# Patient Record
Sex: Female | Born: 1980 | Race: White | Hispanic: No | Smoking: Light tobacco smoker
Health system: Southern US, Community
[De-identification: ages and names within clinical notes are randomized; demographics above are authoritative.]

## PROBLEM LIST (undated history)

## (undated) DIAGNOSIS — R87629 Unspecified abnormal cytological findings in specimens from vagina: Secondary | ICD-10-CM

## (undated) DIAGNOSIS — M419 Scoliosis, unspecified: Secondary | ICD-10-CM

## (undated) DIAGNOSIS — R519 Headache, unspecified: Secondary | ICD-10-CM

## (undated) DIAGNOSIS — Z8619 Personal history of other infectious and parasitic diseases: Secondary | ICD-10-CM

## (undated) DIAGNOSIS — R87619 Unspecified abnormal cytological findings in specimens from cervix uteri: Secondary | ICD-10-CM

## (undated) DIAGNOSIS — R51 Headache: Secondary | ICD-10-CM

## (undated) DIAGNOSIS — IMO0002 Reserved for concepts with insufficient information to code with codable children: Secondary | ICD-10-CM

## (undated) HISTORY — DX: Reserved for concepts with insufficient information to code with codable children: IMO0002

## (undated) HISTORY — DX: Unspecified abnormal cytological findings in specimens from cervix uteri: R87.619

## (undated) HISTORY — PX: COLPOSCOPY: SHX161

## (undated) SURGERY — Surgical Case
Anesthesia: *Unknown

---

## 2001-05-06 ENCOUNTER — Emergency Department (HOSPITAL_COMMUNITY): Admission: EM | Admit: 2001-05-06 | Discharge: 2001-05-06 | Payer: Self-pay

## 2004-12-25 ENCOUNTER — Other Ambulatory Visit: Admission: RE | Admit: 2004-12-25 | Discharge: 2004-12-25 | Payer: Self-pay | Admitting: Family Medicine

## 2007-08-23 ENCOUNTER — Other Ambulatory Visit: Admission: RE | Admit: 2007-08-23 | Discharge: 2007-08-23 | Payer: Self-pay | Admitting: Family Medicine

## 2008-10-18 ENCOUNTER — Other Ambulatory Visit: Admission: RE | Admit: 2008-10-18 | Discharge: 2008-10-18 | Payer: Self-pay | Admitting: Family Medicine

## 2010-05-19 ENCOUNTER — Emergency Department (HOSPITAL_COMMUNITY): Admission: EM | Admit: 2010-05-19 | Discharge: 2010-05-20 | Payer: Self-pay | Admitting: Emergency Medicine

## 2010-07-23 ENCOUNTER — Other Ambulatory Visit: Admission: RE | Admit: 2010-07-23 | Discharge: 2010-07-23 | Payer: Self-pay | Admitting: Family Medicine

## 2011-02-16 LAB — RAPID STREP SCREEN (MED CTR MEBANE ONLY): Streptococcus, Group A Screen (Direct): NEGATIVE

## 2011-04-08 ENCOUNTER — Other Ambulatory Visit: Payer: Self-pay | Admitting: Family Medicine

## 2011-04-08 ENCOUNTER — Other Ambulatory Visit (HOSPITAL_COMMUNITY)
Admission: RE | Admit: 2011-04-08 | Discharge: 2011-04-08 | Disposition: A | Discharge status: Home / Self Care | Payer: 59 | Source: Ambulatory Visit | Attending: Family Medicine | Admitting: Family Medicine

## 2011-04-08 DIAGNOSIS — Z124 Encounter for screening for malignant neoplasm of cervix: Secondary | ICD-10-CM | POA: Insufficient documentation

## 2011-11-13 IMAGING — CR DG CHEST 2V
2 series · 2 of 2 positions shown · non-contrast
Comparison: None.

CLINICAL DATA: Cough, congestion and sore throat.

CHEST - 2 VIEW

[w chest pa]
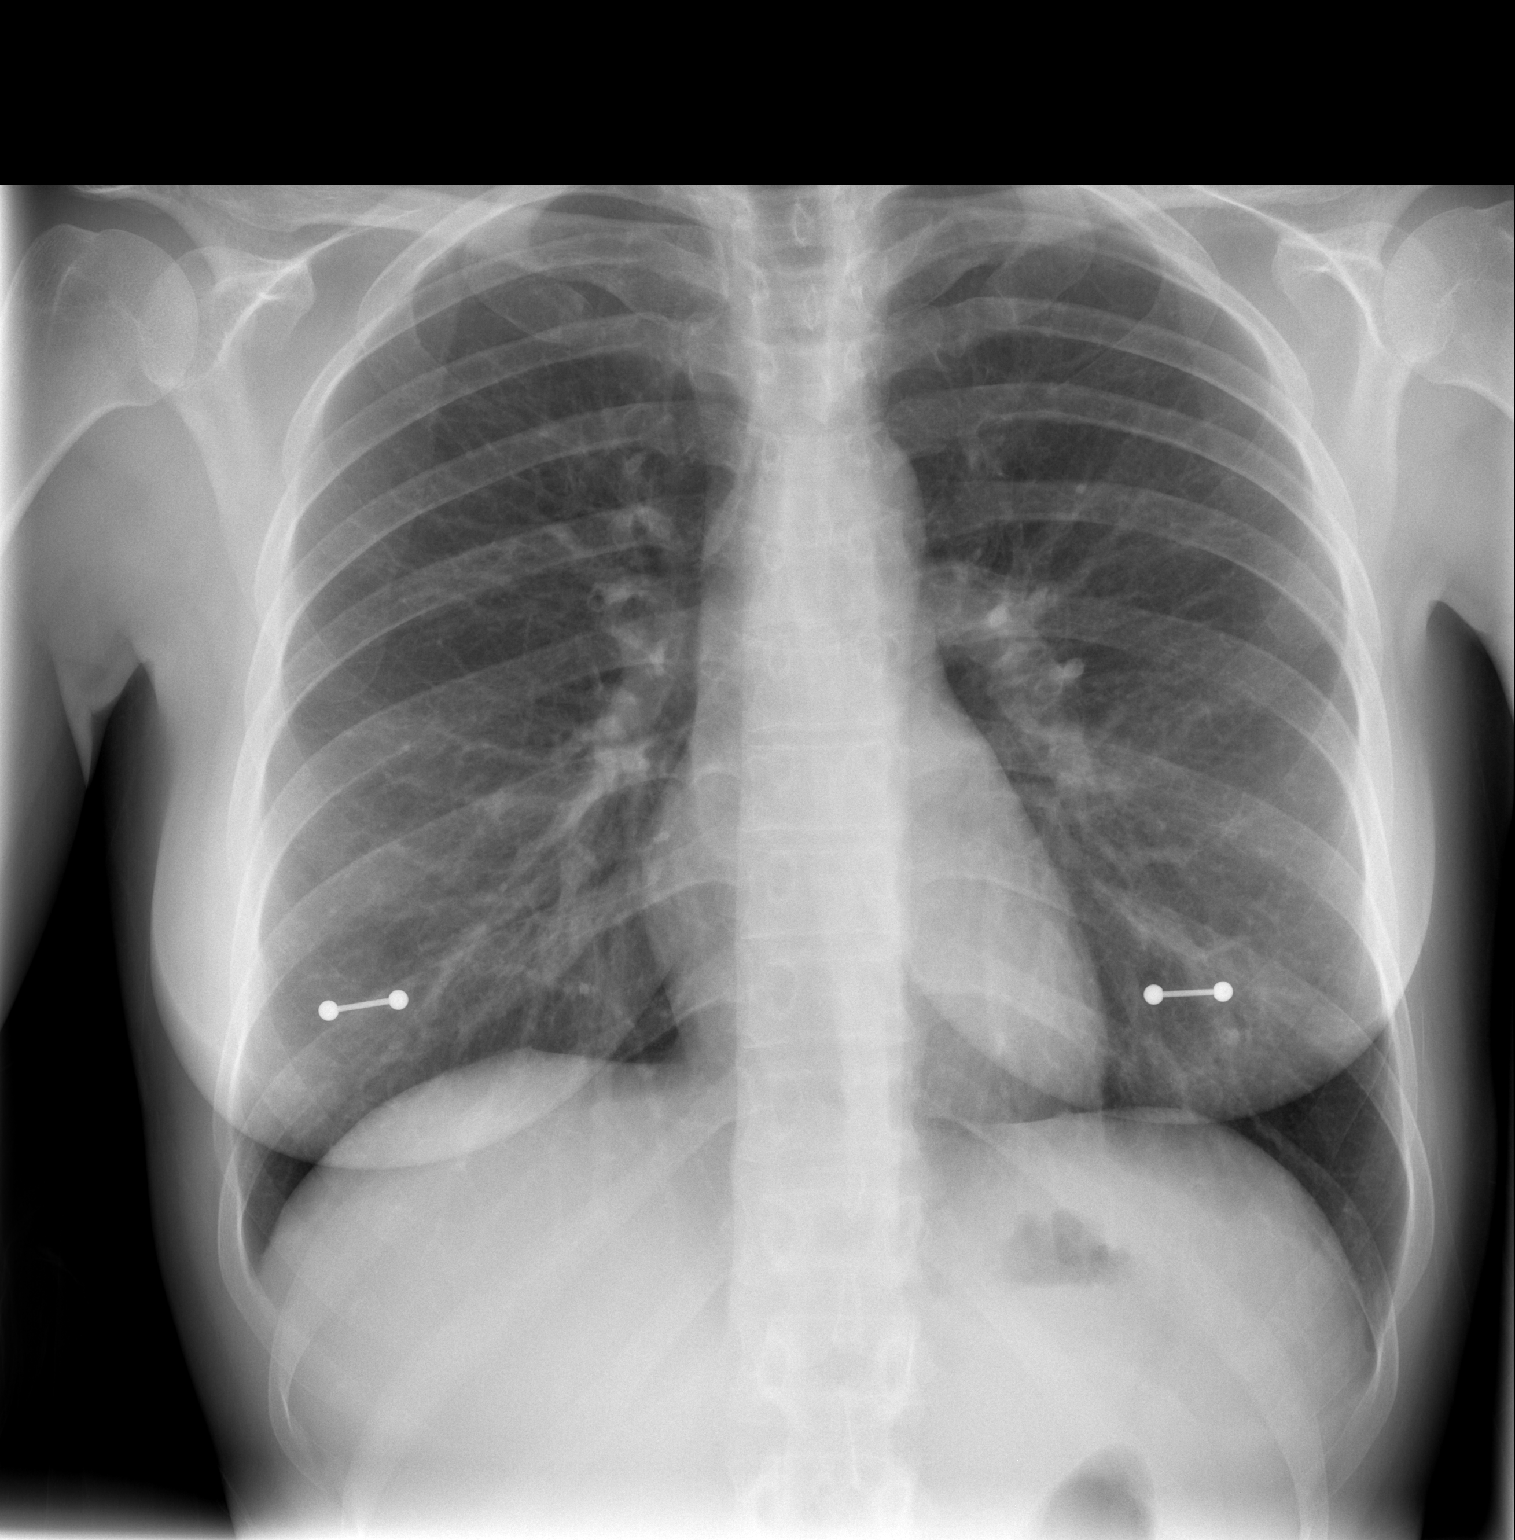

[w chest lat]
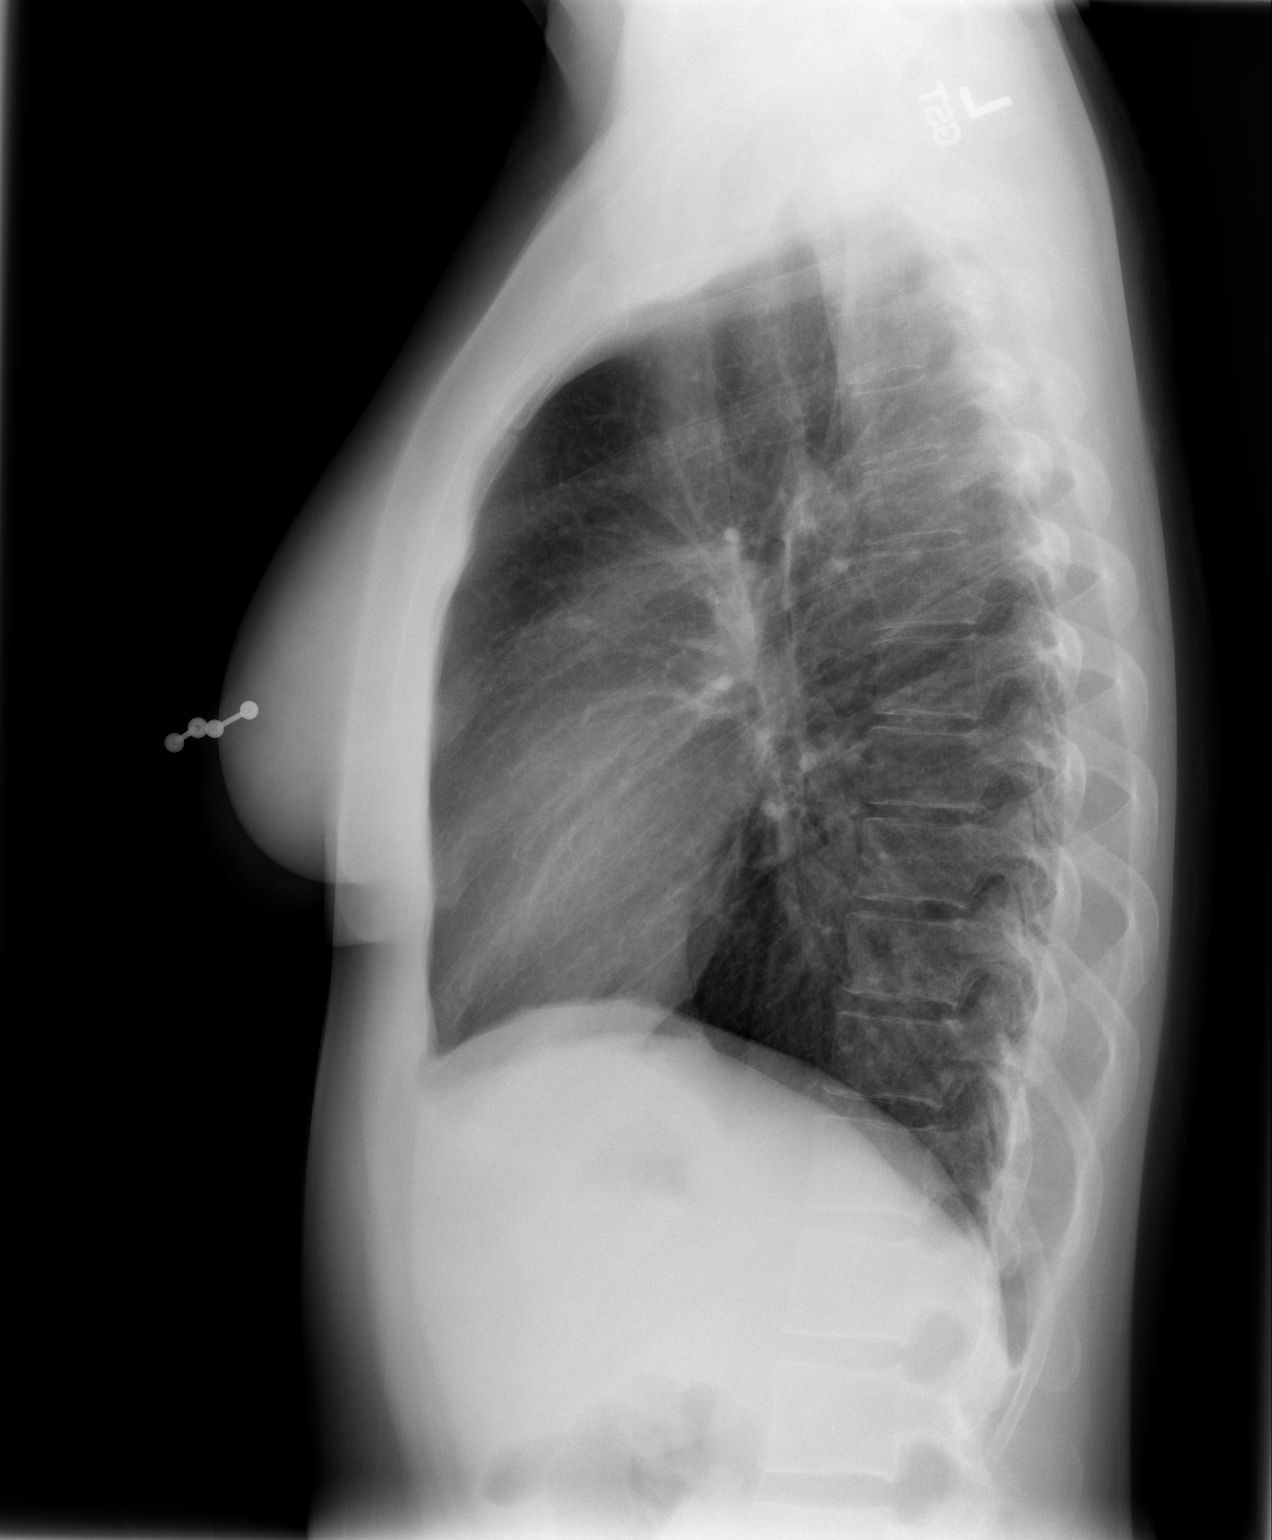

[2 of 2 positions shown; findings below may reference images not displayed]

FINDINGS: The lungs are well-aerated and clear.  There is no
evidence of focal opacification, pleural effusion or pneumothorax.

The heart is normal in size; the mediastinal contour is within
normal limits.  No acute osseous abnormalities are seen. Metallic
nipple piercings are noted bilaterally.
IMPRESSION: No acute cardiopulmonary process seen.

## 2012-02-24 ENCOUNTER — Other Ambulatory Visit: Payer: Self-pay | Admitting: Family Medicine

## 2012-02-24 ENCOUNTER — Other Ambulatory Visit (HOSPITAL_COMMUNITY)
Admission: RE | Admit: 2012-02-24 | Discharge: 2012-02-24 | Disposition: A | Discharge status: Home / Self Care | Payer: 59 | Source: Ambulatory Visit | Attending: Family Medicine | Admitting: Family Medicine

## 2012-02-24 DIAGNOSIS — Z124 Encounter for screening for malignant neoplasm of cervix: Secondary | ICD-10-CM | POA: Insufficient documentation

## 2012-02-24 DIAGNOSIS — R87810 Cervical high risk human papillomavirus (HPV) DNA test positive: Secondary | ICD-10-CM | POA: Insufficient documentation

## 2012-05-28 LAB — OB RESULTS CONSOLE HEPATITIS B SURFACE ANTIGEN: Hepatitis B Surface Ag: NEGATIVE

## 2012-05-28 LAB — OB RESULTS CONSOLE ABO/RH: RH Type: POSITIVE

## 2012-05-28 LAB — OB RESULTS CONSOLE RUBELLA ANTIBODY, IGM: Rubella: IMMUNE

## 2012-05-28 LAB — OB RESULTS CONSOLE HIV ANTIBODY (ROUTINE TESTING): HIV: NONREACTIVE

## 2012-05-28 LAB — OB RESULTS CONSOLE GC/CHLAMYDIA: Chlamydia: NEGATIVE

## 2012-10-13 LAB — OB RESULTS CONSOLE RPR: RPR: NONREACTIVE

## 2012-12-15 LAB — OB RESULTS CONSOLE GBS: GBS: POSITIVE

## 2013-01-05 ENCOUNTER — Telehealth (HOSPITAL_COMMUNITY): Payer: Self-pay | Admitting: *Deleted

## 2013-01-05 ENCOUNTER — Encounter (HOSPITAL_COMMUNITY): Payer: Self-pay | Admitting: *Deleted

## 2013-01-05 NOTE — Telephone Encounter (Signed)
Preadmission screen  

## 2013-01-08 ENCOUNTER — Encounter (HOSPITAL_COMMUNITY): Payer: Self-pay | Admitting: Anesthesiology

## 2013-01-08 ENCOUNTER — Inpatient Hospital Stay (HOSPITAL_COMMUNITY)
Admission: AD | Admit: 2013-01-08 | Discharge: 2013-01-12 | DRG: 765 | Disposition: A | Discharge status: Home / Self Care | Payer: 59 | Source: Ambulatory Visit | Attending: Obstetrics and Gynecology | Admitting: Obstetrics and Gynecology

## 2013-01-08 ENCOUNTER — Encounter (HOSPITAL_COMMUNITY): Payer: Self-pay | Admitting: *Deleted

## 2013-01-08 DIAGNOSIS — O99892 Other specified diseases and conditions complicating childbirth: Secondary | ICD-10-CM | POA: Diagnosis present

## 2013-01-08 DIAGNOSIS — O324XX0 Maternal care for high head at term, not applicable or unspecified: Secondary | ICD-10-CM | POA: Diagnosis present

## 2013-01-08 DIAGNOSIS — O4100X0 Oligohydramnios, unspecified trimester, not applicable or unspecified: Secondary | ICD-10-CM | POA: Diagnosis present

## 2013-01-08 DIAGNOSIS — Z22330 Carrier of Group B streptococcus: Secondary | ICD-10-CM

## 2013-01-08 DIAGNOSIS — Z98891 History of uterine scar from previous surgery: Secondary | ICD-10-CM

## 2013-01-08 DIAGNOSIS — O99334 Smoking (tobacco) complicating childbirth: Secondary | ICD-10-CM | POA: Diagnosis present

## 2013-01-08 LAB — URINALYSIS, ROUTINE W REFLEX MICROSCOPIC
Glucose, UA: NEGATIVE mg/dL
Glucose, UA: NEGATIVE mg/dL
Ketones, ur: NEGATIVE mg/dL
Ketones, ur: NEGATIVE mg/dL
Leukocytes, UA: NEGATIVE
Leukocytes, UA: NEGATIVE
Nitrite: NEGATIVE
Nitrite: NEGATIVE
Protein, ur: NEGATIVE mg/dL
Specific Gravity, Urine: 1.02 (ref 1.005–1.030)
Urobilinogen, UA: 0.2 mg/dL (ref 0.0–1.0)
pH: 6.5 (ref 5.0–8.0)

## 2013-01-08 LAB — POCT FERN TEST: POCT Fern Test: NEGATIVE

## 2013-01-08 LAB — URINE MICROSCOPIC-ADD ON

## 2013-01-08 LAB — CBC
MCHC: 34.2 g/dL (ref 30.0–36.0)
Platelets: 248 10*3/uL (ref 150–400)
RDW: 13.3 % (ref 11.5–15.5)
WBC: 15.2 10*3/uL — ABNORMAL HIGH (ref 4.0–10.5)

## 2013-01-08 LAB — AMNISURE RUPTURE OF MEMBRANE (ROM) NOT AT ARMC: Amnisure ROM: POSITIVE

## 2013-01-08 MED ORDER — ONDANSETRON HCL 4 MG/2ML IJ SOLN
4.0000 mg | Freq: Four times a day (QID) | INTRAMUSCULAR | Status: DC | PRN
  Administered 2013-01-09: 4 mg via INTRAVENOUS
  Filled 2013-01-08: qty 2

## 2013-01-08 MED ORDER — DEXTROSE 5 % IV SOLN
5.0000 10*6.[IU] | Freq: Once | INTRAVENOUS | Status: AC
  Administered 2013-01-08: 5 10*6.[IU] via INTRAVENOUS
  Filled 2013-01-08: qty 5

## 2013-01-08 MED ORDER — OXYCODONE-ACETAMINOPHEN 5-325 MG PO TABS: 1.0000 | ORAL_TABLET | ORAL | Status: DC | PRN

## 2013-01-08 MED ORDER — OXYTOCIN BOLUS FROM INFUSION: 500.0000 mL | INTRAVENOUS | Status: DC

## 2013-01-08 MED ORDER — TERBUTALINE SULFATE 1 MG/ML IJ SOLN: 0.2500 mg | Freq: Once | INTRAMUSCULAR | Status: AC | PRN

## 2013-01-08 MED ORDER — OXYTOCIN 40 UNITS IN LACTATED RINGERS INFUSION - SIMPLE MED: 62.5000 mL/h | INTRAVENOUS | Status: DC

## 2013-01-08 MED ORDER — IBUPROFEN 600 MG PO TABS
600.0000 mg | ORAL_TABLET | Freq: Four times a day (QID) | ORAL | Status: DC | PRN
Start: 2013-01-08 — End: 2013-01-10

## 2013-01-08 MED ORDER — LACTATED RINGERS IV SOLN
INTRAVENOUS | Status: DC
  Administered 2013-01-09 (×6): via INTRAVENOUS

## 2013-01-08 MED ORDER — MISOPROSTOL 25 MCG QUARTER TABLET
25.0000 ug | ORAL_TABLET | ORAL | Status: DC | PRN
  Administered 2013-01-08 – 2013-01-09 (×3): 25 ug via VAGINAL
  Filled 2013-01-08 (×3): qty 0.25

## 2013-01-08 MED ORDER — PENICILLIN G POTASSIUM 5000000 UNITS IJ SOLR: 5.0000 10*6.[IU] | Freq: Once | INTRAMUSCULAR | Status: DC

## 2013-01-08 MED ORDER — LIDOCAINE HCL (PF) 1 % IJ SOLN
30.0000 mL | INTRAMUSCULAR | Status: DC | PRN
  Filled 2013-01-08: qty 30

## 2013-01-08 MED ORDER — ACETAMINOPHEN 325 MG PO TABS: 650.0000 mg | ORAL_TABLET | ORAL | Status: DC | PRN

## 2013-01-08 MED ORDER — LACTATED RINGERS IV SOLN: 500.0000 mL | INTRAVENOUS | Status: DC | PRN

## 2013-01-08 MED ORDER — PENICILLIN G POTASSIUM 5000000 UNITS IJ SOLR
2.5000 10*6.[IU] | INTRAVENOUS | Status: DC
  Administered 2013-01-09 (×6): 2.5 10*6.[IU] via INTRAVENOUS
  Filled 2013-01-08 (×11): qty 2.5

## 2013-01-08 MED ORDER — FLEET ENEMA 7-19 GM/118ML RE ENEM
1.0000 | ENEMA | RECTAL | Status: DC | PRN
Start: 2013-01-08 — End: 2013-01-10

## 2013-01-08 MED ORDER — CITRIC ACID-SODIUM CITRATE 334-500 MG/5ML PO SOLN
30.0000 mL | ORAL | Status: DC | PRN
  Administered 2013-01-10: 30 mL via ORAL
  Filled 2013-01-08: qty 15

## 2013-01-08 NOTE — MAU Provider Note (Signed)
History     CSN: [id number]  Arrival date and time: 01/08/13 1201   First Provider Initiated Contact with Patient 01/08/13 1305      Chief Complaint  Patient presents with  . Pelvic Pain   HPI MARGREAT WIDENER is 32 y.o. G1P0 [redacted]w[redacted]d weeks presenting with ? UTI.  She spoke with Dr. Christell Constant and was instructed to come in here for further evaluation. Patient of Dr. Bing Ree'.   Reports dysuria that began last night, abnormal flow of urine X 2 days,  Denies hematuria.  Last seen in the office Tuesday. States she has had "gushes of fluid on and office X 5 days".  Denies fever and chills.      Past Medical History  Diagnosis Date  . Abnormal Pap smear     Past Surgical History  Procedure Laterality Date  . Colposcopy      Family History  Problem Relation Age of Onset  . Other Neg Hx   . Hypertension Mother   . Heart attack Mother     History  Substance Use Topics  . Smoking status: Light Tobacco Smoker -- 0.10 packs/day  . Smokeless tobacco: Never Used  . Alcohol Use: No    Allergies: No Known Allergies  Prescriptions prior to admission  Medication Sig Dispense Refill  . acetaminophen (TYLENOL) 325 MG tablet Take 325 mg by mouth every 6 (six) hours as needed for pain.      . Prenatal Vit-Fe Fumarate-FA (PRENATAL MULTIVITAMIN) TABS Take 1 tablet by mouth daily.        Review of Systems  Constitutional: Negative for fever and chills.  Gastrointestinal: Negative for abdominal pain.  Genitourinary: Positive for dysuria and frequency. Negative for urgency, hematuria and flank pain.       ? Gushes of fluid off and on this X 5 days.   Physical Exam   Blood pressure 126/79, pulse 87, temperature 98.6 F (37 C), temperature source Oral, resp. rate 18, height 5\' 3"  (1.6 m), weight 211 lb 8 oz (95.936 kg), last menstrual period 04/06/2012.  Physical Exam  Constitutional: She is oriented to person, place, and time. She appears well-developed and well-nourished. No distress.  HENT:   Head: Normocephalic.  Neck: Normal range of motion.  Cardiovascular: Normal rate.   Respiratory: Effort normal.  GI: Soft. She exhibits no distension and no mass. There is no tenderness. There is no rebound and no guarding.  Genitourinary: There is no tenderness, lesion or injury on the right labia. There is no tenderness, lesion or injury on the left labia. Uterus is enlarged. Uterus is not tender. Cervix exhibits no discharge and no friability. No erythema or bleeding around the vagina. Vaginal discharge (moderate amount of liquidy fluid that is white  Without odor or blood) found.  Cervix is closed, soft, high.  Neurological: She is alert and oriented to person, place, and time.  Skin: Skin is warm and dry.  Psychiatric: She has a normal mood and affect. Her behavior is normal.   FERN was NEGATIVE  Results for orders placed during the hospital encounter of 01/08/13 (from the past 24 hour(s))  URINALYSIS, ROUTINE W REFLEX MICROSCOPIC     Status: Abnormal   Collection Time    01/08/13 12:20 PM      Result Value Range   Color, Urine YELLOW  YELLOW   APPearance CLEAR  CLEAR   Specific Gravity, Urine 1.025  1.005 - 1.030   pH 6.5  5.0 - 8.0   Glucose,  UA NEGATIVE  NEGATIVE mg/dL   Hgb urine dipstick MODERATE (*) NEGATIVE   Bilirubin Urine NEGATIVE  NEGATIVE   Ketones, ur NEGATIVE  NEGATIVE mg/dL   Protein, ur NEGATIVE  NEGATIVE mg/dL   Urobilinogen, UA 0.2  0.0 - 1.0 mg/dL   Nitrite NEGATIVE  NEGATIVE   Leukocytes, UA NEGATIVE  NEGATIVE  URINE MICROSCOPIC-ADD ON     Status: Abnormal   Collection Time    01/08/13 12:20 PM      Result Value Range   Squamous Epithelial / LPF MANY (*) RARE   WBC, UA 0-2  <3 WBC/hpf   RBC / HPF 11-20  <3 RBC/hpf   Bacteria, UA FEW (*) RARE   Casts GRANULAR CAST (*) NEGATIVE   Urine-Other MUCOUS PRESENT    AMNISURE RUPTURE OF MEMBRANE (ROM)     Status: None   Collection Time    01/08/13  1:35 PM      Result Value Range   Amnisure ROM POSITIVE      MAU Course  Procedures  Fern collected FMS-baseline FHR 130-135bpm, reactive.  I decel X 1.5 minutes.  Occ mild contraction    MDM 13:15 reported MSE to Dr. Christell Constant.  Order given for Cath ua, if patient refuses, may be a cleaner voided urine, Fern--if negative amniosure. 14:20  Reported lab and FMS results to dr. Christell Constant.  Cath UA is pending.  Order to ADMIT  Assessment and Plan  A:  [redacted]w[redacted]d gestation with UTI sxs      + amniosure  P:  Admit  KEY,EVE M 01/08/2013, 2:27 PM   Pt admitted and please see H&P

## 2013-01-08 NOTE — H&P (Addendum)
Caitlin Simmons is a 32 y.o. female G1 at 3w4  presenting for evaluation of possible rupture of membranes and amnisure was positive in MAU and pt was here for UTI evaluation but I was concerned about her saying that she had several gushes of fluid over the last week and last was yesterday.   US revealed vertex and no discernible fluid in any of the 4 quadrants  I have discussed with the pt the plan of care and the need for induction and the potential for CSx and forceps and vacuum   Maternal Medical History:  Reason for admission: Rupture of membranes and nausea.   Contractions: Onset was 6-12 hours ago.   Frequency: irregular.   Duration is approximately 20 seconds.   Perceived severity is mild.    Fetal activity: Perceived fetal activity is normal.   Last perceived fetal movement was within the past hour.    Prenatal complications: no prenatal complications No bleeding, cholelithiasis, HIV, hypertension, infection, IUGR, nephrolithiasis, oligohydramnios, placental abnormality, polyhydramnios, pre-eclampsia, preterm labor, substance abuse, thrombocytopenia or thrombophilia.     OB History   Grav Para Term Preterm Abortions TAB SAB Ect Mult Living   1              Past Medical History  Diagnosis Date  . Abnormal Pap smear    Past Surgical History  Procedure Laterality Date  . Colposcopy     Family History: family history includes Heart attack in her mother and Hypertension in her mother.  There is no history of Other. Social History:  reports that she has been smoking.  She has never used smokeless tobacco. She reports that she does not drink alcohol or use illicit drugs.   Prenatal Transfer Tool  Maternal Diabetes: No Genetic Screening: no screening Maternal Ultrasounds/Referrals: Normal per pt Fetal Ultrasounds or other Referrals:  None Maternal Substance Abuse:  Yes tobacco use less than 5 per day Significant Maternal Medications:  None Significant Maternal Lab  Results:  None Other Comments:  None  Review of Systems  Constitutional: Negative.  Negative for fever, chills and weight loss.  HENT: Negative.  Negative for congestion and sore throat.   Eyes: Negative.  Negative for blurred vision and redness.  Respiratory: Negative.  Negative for cough.   Cardiovascular: Positive for leg swelling. Negative for chest pain and palpitations.  Gastrointestinal: Positive for nausea. Negative for vomiting.  Genitourinary: Positive for dysuria and frequency.  Musculoskeletal: Negative for falls.  Skin: Negative.  Negative for rash.  Neurological: Negative.  Negative for dizziness, tingling, tremors, loss of consciousness and headaches.  Endo/Heme/Allergies: Negative.  Negative for polydipsia. Does not bruise/bleed easily.  Psychiatric/Behavioral: Negative.  Negative for depression.    Dilation: Closed Effacement (%): Thick Station: -2 Exam by:: K.Wilson,RN Blood pressure 126/79, pulse 87, temperature 98.6 F (37 C), temperature source Oral, resp. rate 18, height 5\' 3"  (1.6 m), weight 95.936 kg (211 lb 8 oz), last menstrual period 04/06/2012. Maternal Exam:  Uterine Assessment: Contraction frequency is regular.  Not really contracting  Abdomen: Fundal height is term.   Estimated fetal weight is 7#8.   Fetal presentation: vertex  Introitus: Pt examined in MAU and will be for cytotec placement     Physical Exam  Constitutional: She is oriented to person, place, and time. She appears well-developed and well-nourished.  HENT:  Head: Normocephalic and atraumatic.  Eyes: Conjunctivae and EOM are normal. Pupils are equal, round, and reactive to light. Right eye exhibits no discharge. Left  eye exhibits no discharge.  Neck: Normal range of motion. Neck supple. No thyromegaly present.  Cardiovascular: Normal rate.   Respiratory: Effort normal and breath sounds normal. She has no wheezes.  GI: Soft. There is no tenderness. There is no rebound.   Genitourinary: Vagina normal and uterus normal.  Closed/50/-3  Musculoskeletal: Normal range of motion. She exhibits edema.  +1  Neurological: She is alert and oriented to person, place, and time.  Skin: Skin is warm and dry. No erythema.  Psychiatric: She has a normal mood and affect. Her behavior is normal. Judgment and thought content normal.    Prenatal labs: ABO, Rh: O/Positive/-- (06/28 0000) Antibody: Negative (06/28 0000) Rubella: Immune (06/28 0000) RPR: Nonreactive (11/13 0000)  HBsAg: Negative (06/28 0000)  HIV: Non-reactive (06/28 0000)  GBS: Positive (01/15 0000)   Assessment/Plan: IUP 39w4 GBS positive ROM prolonged Tobacco  Plan to admit for induction and with cervical ripening and start GBS prophylaxis    Addendum placed cytotec 2/50/-3 vertex confirmed and cytotec placed bloody show noted  Contracted pelvis but gynecoid Caitlin Hausner H. 01/08/2013, 2:40 PM

## 2013-01-08 NOTE — MAU Note (Signed)
Pt reports having constant pain in her vaginal area. Reports having several gushes of fluid/discharge since begingig of week.  Good fetal movement reported and denies cramping at this time. Had cramping yesterday.

## 2013-01-08 NOTE — Progress Notes (Signed)
Caitlin Simmons is a 32 y.o. G1P0 at [redacted]w[redacted]d by LMP admitted for rupture of membranes  Subjective:  Starting to feel more contractions Objective: BP 119/82  Pulse 87  Temp(Src) 98.2 F (36.8 C) (Oral)  Resp 18  Ht 5\' 3"  (1.6 m)  Wt 95.709 kg (211 lb)  BMI 37.39 kg/m2  SpO2 97%  LMP 04/06/2012      FHT:  FHR: 130s bpm, variability: moderate,  accelerations:  Present,  decelerations:  Absent UC:   irregular, every 3 in 10 minutes SVE:   Dilation: 2 Effacement (%): 50 Station: -3 Exam by:: Caitlin Simmons Not examined but seen Labs: Lab Results  Component Value Date   WBC 15.2* 01/08/2013   HGB 14.0 01/08/2013   HCT 40.9 01/08/2013   MCV 90.5 01/08/2013   PLT 248 01/08/2013    Assessment / Plan: Induction of labor due to SROM prolonged, GBS pos, oligo, tobacco,  progressing well on pitocin  Labor: s/p cytotec x 1 Preeclampsia:  na Fetal Wellbeing:  Category I Pain Control:  plans for CLE I/D:  on PCN Anticipated MOD:  I am concerned about the potential for CSx in gynecoid but contracted pelvis Will continue with cytotec  Caitlin Simmons H. 01/08/2013, 8:28 PM

## 2013-01-09 LAB — TYPE AND SCREEN
ABO/RH(D): O POS
Antibody Screen: NEGATIVE

## 2013-01-09 MED ORDER — PHENYLEPHRINE 40 MCG/ML (10ML) SYRINGE FOR IV PUSH (FOR BLOOD PRESSURE SUPPORT)
80.0000 ug | PREFILLED_SYRINGE | INTRAVENOUS | Status: DC | PRN
  Filled 2013-01-09: qty 5

## 2013-01-09 MED ORDER — FENTANYL 2.5 MCG/ML BUPIVACAINE 1/10 % EPIDURAL INFUSION (WH - ANES)
14.0000 mL/h | INTRAMUSCULAR | Status: DC
  Administered 2013-01-09 (×2): 14 mL/h via EPIDURAL
  Filled 2013-01-09 (×2): qty 125

## 2013-01-09 MED ORDER — LACTATED RINGERS IV SOLN
500.0000 mL | Freq: Once | INTRAVENOUS | Status: AC
  Administered 2013-01-09: 13:00:00 via INTRAVENOUS

## 2013-01-09 MED ORDER — TERBUTALINE SULFATE 1 MG/ML IJ SOLN: 0.2500 mg | Freq: Once | INTRAMUSCULAR | Status: AC | PRN

## 2013-01-09 MED ORDER — LIDOCAINE HCL (PF) 1 % IJ SOLN
INTRAMUSCULAR | Status: DC | PRN
  Administered 2013-01-09 (×4): 4 mL

## 2013-01-09 MED ORDER — OXYTOCIN 40 UNITS IN LACTATED RINGERS INFUSION - SIMPLE MED
1.0000 m[IU]/min | INTRAVENOUS | Status: DC
  Administered 2013-01-09: 2 m[IU]/min via INTRAVENOUS
  Filled 2013-01-09: qty 1000

## 2013-01-09 MED ORDER — EPHEDRINE 5 MG/ML INJ
10.0000 mg | INTRAVENOUS | Status: DC | PRN
  Administered 2013-01-09: 10 mg via INTRAVENOUS

## 2013-01-09 MED ORDER — DIPHENHYDRAMINE HCL 50 MG/ML IJ SOLN: 12.5000 mg | INTRAMUSCULAR | Status: DC | PRN

## 2013-01-09 MED ORDER — PHENYLEPHRINE 40 MCG/ML (10ML) SYRINGE FOR IV PUSH (FOR BLOOD PRESSURE SUPPORT): 80.0000 ug | PREFILLED_SYRINGE | INTRAVENOUS | Status: DC | PRN

## 2013-01-09 MED ORDER — EPHEDRINE 5 MG/ML INJ
10.0000 mg | INTRAVENOUS | Status: DC | PRN
  Filled 2013-01-09: qty 4

## 2013-01-09 NOTE — Anesthesia Preprocedure Evaluation (Signed)
Anesthesia Evaluation  Patient identified by MRN, date of birth, ID band Patient awake    Reviewed: Allergy & Precautions, H&P , NPO status , Patient's Chart, lab work & pertinent test results, reviewed documented beta blocker date and time   History of Anesthesia Complications Negative for: history of anesthetic complications  Airway Mallampati: I TM Distance: >3 FB Neck ROM: full    Dental  (+) Teeth Intact   Pulmonary Current Smoker,  breath sounds clear to auscultation        Cardiovascular negative cardio ROS  Rhythm:regular Rate:Normal     Neuro/Psych negative neurological ROS  negative psych ROS   GI/Hepatic negative GI ROS, Neg liver ROS,   Endo/Other  negative endocrine ROS  Renal/GU negative Renal ROS     Musculoskeletal   Abdominal   Peds  Hematology negative hematology ROS (+)   Anesthesia Other Findings   Reproductive/Obstetrics (+) Pregnancy                           Anesthesia Physical Anesthesia Plan  ASA: II  Anesthesia Plan: Epidural   Post-op Pain Management:    Induction:   Airway Management Planned:   Additional Equipment:   Intra-op Plan:   Post-operative Plan:   Informed Consent: I have reviewed the patients History and Physical, chart, labs and discussed the procedure including the risks, benefits and alternatives for the proposed anesthesia with the patient or authorized representative who has indicated his/her understanding and acceptance.     Plan Discussed with:   Anesthesia Plan Comments:         Anesthesia Quick Evaluation  

## 2013-01-09 NOTE — Progress Notes (Signed)
Caitlin Simmons is a 32 y.o. G1P0 at [redacted]w[redacted]d by LMP admitted for rupture of membranes  Subjective: Desires CLE as pain is increasing Objective: BP 115/73  Pulse 68  Temp(Src) 97.8 F (36.6 C) (Oral)  Resp 20  Ht 5\' 3"  (1.6 m)  Wt 95.709 kg (211 lb)  BMI 37.39 kg/m2  SpO2 98%  LMP 04/06/2012      FHT:  FHR: 115s bpm, variability: moderate,  accelerations:  Present,  decelerations:  Present mild and minimal UC:   regular, every 3-4 minutes SVE:   Dilation: 2 Effacement (%): 60 Station: -2 Exam by:: Caitlin Ip RN 2/90/-3 with caput  Labs: Lab Results  Component Value Date   WBC 15.2* 01/08/2013   HGB 14.0 01/08/2013   HCT 40.9 01/08/2013   MCV 90.5 01/08/2013   PLT 248 01/08/2013    Assessment / Plan: Induction of labor due to spontaneous prolonged rupture of membranes,  progressing well on pitocin  Labor: latent phase Preeclampsia:  na Fetal Wellbeing:  Category I Pain Control:  desires CLE I/D:  GBS pos on PCN Anticipated MOD:  NSVD and am concerned about the contracted pelvis and the 8+ baby will continue with trial of labor Ordered CLE Pitocin 6  Timon Geissinger H. 01/09/2013, 11:50 AM

## 2013-01-09 NOTE — Progress Notes (Signed)
Caitlin Simmons is a 32 y.o. G1P0 at [redacted]w[redacted]d by LMP admitted for induction of labor due to Low amniotic fluid. and, rupture of membranes.  Has received 3 doses of cytotec   Subjective:  States she feels the contractions and pressure more Objective: BP 93/51  Pulse 70  Temp(Src) 97.8 F (36.6 C) (Axillary)  Resp 16  Ht 5\' 3"  (1.6 m)  Wt 95.709 kg (211 lb)  BMI 37.39 kg/m2  SpO2 98%  LMP 04/06/2012      FHT:  FHR: 120s bpm, variability: moderate,  accelerations:  Present,  decelerations:  Absent UC:   irregular, every 2-4 minutes and sometimes difficult to determine SVE:   Dilation: 1.5 Effacement (%): 60 Station: -2 Exam by:: Lilli Few, RN  Labs: Lab Results  Component Value Date   WBC 15.2* 01/08/2013   HGB 14.0 01/08/2013   HCT 40.9 01/08/2013   MCV 90.5 01/08/2013   PLT 248 01/08/2013    Assessment / Plan: prolonged ROM and still being ripened and hopeful that we can progress today  Labor: as above Preeclampsia:  na Fetal Wellbeing:  Category I Pain Control:  plan CLE I/D:  GBS positive on PCN Anticipated MOD:  am concerned that a CSx is a potential for this pt with the contracted pelvis  Korynn Kenedy H. 01/09/2013, 6:20 AM

## 2013-01-09 NOTE — Progress Notes (Signed)
Patient ID: Caitlin Simmons, female   DOB: 03-Jan-1981, 32 y.o.   MRN: [id number] FHR strip reviewed and has recovered with spontaneous accels now

## 2013-01-09 NOTE — Progress Notes (Signed)
Caitlin Simmons is a 32 y.o. G1P0 at [redacted]w[redacted]d by LMP admitted for rupture of membranes  Subjective: With adequate pain control but is starting to feel pressure  Objective: BP 92/47  Pulse 91  Temp(Src) 97.8 F (36.6 C) (Oral)  Resp 18  Ht 5\' 3"  (1.6 m)  Wt 95.709 kg (211 lb)  BMI 37.39 kg/m2  SpO2 97%  LMP 04/06/2012 I/O last 3 completed shifts: In: -  Out: 1000 [Urine:1000]    FHT:  FHR: 115s  bpm, variability: minimal ,  accelerations:  Present,  decelerations:  Present variables but did have some intermittent late decels and some variables but the FHR responded earlier and expect it to respond and especially in light of the fact that the pt is 9.5 cm dilated UC:   regular, every 1-2 minutes SVE:   Dilation: Lip/rim Effacement (%): 100 Station: +1 Exam by:: [employer]: Lab Results  Component Value Date   WBC 15.2* 01/08/2013   HGB 14.0 01/08/2013   HCT 40.9 01/08/2013   MCV 90.5 01/08/2013   PLT 248 01/08/2013    Assessment / Plan: Induction of labor due to oligo and prolonged ROM and GBS positive,  progressing well on pitocin  Labor: am excited that she has progressed this far and am hopeful for a vaginal birth  Preeclampsia:  na Fetal Wellbeing:  Category II Pain Control:  Epidural I/D:  GBS pos on PCN Anticipated MOD:  NSVD  [name] 01/09/2013, 8:13 PM

## 2013-01-09 NOTE — Progress Notes (Signed)
Patient ID: Caitlin Simmons, female   DOB: 02-08-1981, 32 y.o.   MRN: [id number] S. States almost comfortable O. FHR reassuring after CLE hypotension related decel earlier 3-4 on last exam @150 ) PIT on 2 and contractions not adequate less than 3 per 10 min A. IUP 39w5 Oligo Tobacco P To continue with pitocin and hopefully will enter active phase soon

## 2013-01-09 NOTE — Progress Notes (Signed)
Patient ID: Caitlin Simmons, female   DOB: [birth year], 32 y.o.   MRN: [id number] S. Pushing x 70 min Reportedly now at +2 Still with good pain relief  O. Temp:  [97.7 F (36.5 C)-98.9 F (37.2 C)] 98.9 F (37.2 C) (02/09 2131) Pulse Rate:  [57-91] 69 (02/09 2202) Resp:  [16-20] 18 (02/09 2131) BP: (85-127)/(47-79) 127/71 mmHg (02/09 2202) SpO2:  [95 %-100 %] 97 % (02/09 1737)  FHR reassuring for the second stage with reactive accels  PIT at 7    A. 2nd stage Prolonged ROM with GBS positive on pcn Oligo tobacco P. Expectant management

## 2013-01-09 NOTE — Anesthesia Procedure Notes (Signed)
Epidural Patient location during procedure: OB Start time: 01/09/2013 12:46 PM  Staffing Performed by: anesthesiologist   Preanesthetic Checklist Completed: patient identified, site marked, surgical consent, pre-op evaluation, timeout performed, IV checked, risks and benefits discussed and monitors and equipment checked  Epidural Patient position: sitting Prep: site prepped and draped and DuraPrep Patient monitoring: continuous pulse ox and blood pressure Approach: midline Injection technique: LOR air  Needle:  Needle type: Tuohy  Needle gauge: 17 G Needle length: 9 cm and 9 Needle insertion depth: 5 cm cm Catheter type: closed end flexible Catheter size: 19 Gauge Catheter at skin depth: 10 cm Test dose: negative  Assessment Events: blood not aspirated, injection not painful, no injection resistance, negative IV test and no paresthesia  Additional Notes Discussed risk of headache, infection, bleeding, nerve injury and failed or incomplete block.  Patient voices understanding and wishes to proceed.  Epidural placed on first attempt (after appropriate positioning).  Patient tolerated procedure well with no apparent complications.  No paresthesia.  Jasmine December, MDReason for block:procedure for pain

## 2013-01-10 ENCOUNTER — Inpatient Hospital Stay (HOSPITAL_COMMUNITY): Payer: 59 | Admitting: Anesthesiology

## 2013-01-10 ENCOUNTER — Encounter (HOSPITAL_COMMUNITY): Payer: Self-pay | Admitting: Registered Nurse

## 2013-01-10 ENCOUNTER — Encounter (HOSPITAL_COMMUNITY): Payer: Self-pay | Admitting: Anesthesiology

## 2013-01-10 ENCOUNTER — Encounter (HOSPITAL_COMMUNITY)
Admission: AD | Disposition: A | Discharge status: Home / Self Care | Payer: Self-pay | Source: Ambulatory Visit | Attending: Obstetrics and Gynecology

## 2013-01-10 LAB — CBC
MCH: 30.9 pg (ref 26.0–34.0)
Platelets: 200 10*3/uL (ref 150–400)
RBC: 3.49 MIL/uL — ABNORMAL LOW (ref 3.87–5.11)
WBC: 18.7 10*3/uL — ABNORMAL HIGH (ref 4.0–10.5)

## 2013-01-10 SURGERY — Surgical Case
Anesthesia: Epidural | Site: Abdomen | Wound class: Clean Contaminated

## 2013-01-10 MED ORDER — SENNOSIDES-DOCUSATE SODIUM 8.6-50 MG PO TABS
2.0000 | ORAL_TABLET | Freq: Every day | ORAL | Status: DC
  Administered 2013-01-10 – 2013-01-11 (×2): 2 via ORAL

## 2013-01-10 MED ORDER — LANOLIN HYDROUS EX OINT: 1.0000 "application " | TOPICAL_OINTMENT | CUTANEOUS | Status: DC | PRN

## 2013-01-10 MED ORDER — NALBUPHINE SYRINGE 5 MG/0.5 ML
5.0000 mg | INJECTION | INTRAMUSCULAR | Status: DC | PRN
Start: 2013-01-10 — End: 2013-01-12
  Filled 2013-01-10: qty 1

## 2013-01-10 MED ORDER — ONDANSETRON HCL 4 MG/2ML IJ SOLN
INTRAMUSCULAR | Status: AC
  Filled 2013-01-10: qty 2

## 2013-01-10 MED ORDER — ONDANSETRON HCL 4 MG/2ML IJ SOLN
INTRAMUSCULAR | Status: DC | PRN
  Administered 2013-01-10: 4 mg via INTRAVENOUS

## 2013-01-10 MED ORDER — ZOLPIDEM TARTRATE 5 MG PO TABS: 5.0000 mg | ORAL_TABLET | Freq: Every evening | ORAL | Status: DC | PRN

## 2013-01-10 MED ORDER — KETOROLAC TROMETHAMINE 60 MG/2ML IM SOLN
60.0000 mg | Freq: Once | INTRAMUSCULAR | Status: AC | PRN
  Administered 2013-01-10: 60 mg via INTRAMUSCULAR

## 2013-01-10 MED ORDER — MEPERIDINE HCL 25 MG/ML IJ SOLN: 6.2500 mg | INTRAMUSCULAR | Status: DC | PRN

## 2013-01-10 MED ORDER — NALOXONE HCL 1 MG/ML IJ SOLN
1.0000 ug/kg/h | INTRAVENOUS | Status: DC | PRN
  Filled 2013-01-10: qty 2

## 2013-01-10 MED ORDER — MEPERIDINE HCL 25 MG/ML IJ SOLN
INTRAMUSCULAR | Status: DC | PRN
  Administered 2013-01-10: 25 mg via INTRAVENOUS

## 2013-01-10 MED ORDER — DIBUCAINE 1 % RE OINT: 1.0000 "application " | TOPICAL_OINTMENT | RECTAL | Status: DC | PRN

## 2013-01-10 MED ORDER — KETOROLAC TROMETHAMINE 30 MG/ML IJ SOLN: 15.0000 mg | Freq: Once | INTRAMUSCULAR | Status: DC | PRN

## 2013-01-10 MED ORDER — SCOPOLAMINE 1 MG/3DAYS TD PT72
MEDICATED_PATCH | TRANSDERMAL | Status: AC
  Filled 2013-01-10: qty 1

## 2013-01-10 MED ORDER — KETOROLAC TROMETHAMINE 60 MG/2ML IM SOLN
INTRAMUSCULAR | Status: AC
  Administered 2013-01-10: 60 mg via INTRAMUSCULAR
  Filled 2013-01-10: qty 2

## 2013-01-10 MED ORDER — ONDANSETRON HCL 4 MG/2ML IJ SOLN: 4.0000 mg | Freq: Three times a day (TID) | INTRAMUSCULAR | Status: DC | PRN

## 2013-01-10 MED ORDER — PRENATAL MULTIVITAMIN CH
1.0000 | ORAL_TABLET | Freq: Every day | ORAL | Status: DC
  Administered 2013-01-10 – 2013-01-12 (×3): 1 via ORAL
  Filled 2013-01-10 (×3): qty 1

## 2013-01-10 MED ORDER — SIMETHICONE 80 MG PO CHEW
80.0000 mg | CHEWABLE_TABLET | Freq: Three times a day (TID) | ORAL | Status: DC
  Administered 2013-01-10 – 2013-01-12 (×5): 80 mg via ORAL

## 2013-01-10 MED ORDER — 0.9 % SODIUM CHLORIDE (POUR BTL) OPTIME
TOPICAL | Status: DC | PRN
  Administered 2013-01-10 (×2): 300 mL

## 2013-01-10 MED ORDER — DIPHENHYDRAMINE HCL 50 MG/ML IJ SOLN: 25.0000 mg | INTRAMUSCULAR | Status: DC | PRN

## 2013-01-10 MED ORDER — CEFAZOLIN SODIUM-DEXTROSE 2-3 GM-% IV SOLR
INTRAVENOUS | Status: AC
  Filled 2013-01-10: qty 50

## 2013-01-10 MED ORDER — MORPHINE SULFATE (PF) 0.5 MG/ML IJ SOLN
INTRAMUSCULAR | Status: DC | PRN
  Administered 2013-01-10: 1 mg via INTRAVENOUS
  Administered 2013-01-10: 4 mg via EPIDURAL

## 2013-01-10 MED ORDER — SODIUM BICARBONATE 8.4 % IV SOLN
INTRAVENOUS | Status: AC
  Filled 2013-01-10: qty 50

## 2013-01-10 MED ORDER — ONDANSETRON HCL 4 MG/2ML IJ SOLN: 4.0000 mg | INTRAMUSCULAR | Status: DC | PRN

## 2013-01-10 MED ORDER — SIMETHICONE 80 MG PO CHEW
80.0000 mg | CHEWABLE_TABLET | ORAL | Status: DC | PRN
  Administered 2013-01-11: 80 mg via ORAL

## 2013-01-10 MED ORDER — OXYCODONE-ACETAMINOPHEN 5-325 MG PO TABS
1.0000 | ORAL_TABLET | ORAL | Status: DC | PRN
  Administered 2013-01-11 – 2013-01-12 (×4): 1 via ORAL
  Filled 2013-01-10 (×4): qty 1

## 2013-01-10 MED ORDER — SCOPOLAMINE 1 MG/3DAYS TD PT72
1.0000 | MEDICATED_PATCH | Freq: Once | TRANSDERMAL | Status: DC
  Administered 2013-01-10: 1.5 mg via TRANSDERMAL

## 2013-01-10 MED ORDER — ONDANSETRON HCL 4 MG PO TABS: 4.0000 mg | ORAL_TABLET | ORAL | Status: DC | PRN

## 2013-01-10 MED ORDER — METOCLOPRAMIDE HCL 5 MG/ML IJ SOLN: 10.0000 mg | Freq: Three times a day (TID) | INTRAMUSCULAR | Status: DC | PRN

## 2013-01-10 MED ORDER — DIPHENHYDRAMINE HCL 25 MG PO CAPS: 25.0000 mg | ORAL_CAPSULE | ORAL | Status: DC | PRN

## 2013-01-10 MED ORDER — TETANUS-DIPHTH-ACELL PERTUSSIS 5-2.5-18.5 LF-MCG/0.5 IM SUSP: 0.5000 mL | Freq: Once | INTRAMUSCULAR | Status: DC

## 2013-01-10 MED ORDER — SODIUM BICARBONATE 8.4 % IV SOLN
INTRAVENOUS | Status: DC | PRN
  Administered 2013-01-10: 5 mL via EPIDURAL

## 2013-01-10 MED ORDER — PROMETHAZINE HCL 25 MG/ML IJ SOLN: 6.2500 mg | INTRAMUSCULAR | Status: DC | PRN

## 2013-01-10 MED ORDER — LACTATED RINGERS IV SOLN
INTRAVENOUS | Status: DC | PRN
  Administered 2013-01-10 (×2): via INTRAVENOUS

## 2013-01-10 MED ORDER — LACTATED RINGERS IV SOLN
INTRAVENOUS | Status: DC
  Administered 2013-01-10 (×2): via INTRAVENOUS

## 2013-01-10 MED ORDER — SODIUM CHLORIDE 0.9 % IJ SOLN: 3.0000 mL | INTRAMUSCULAR | Status: DC | PRN

## 2013-01-10 MED ORDER — MENTHOL 3 MG MT LOZG: 1.0000 | LOZENGE | OROMUCOSAL | Status: DC | PRN

## 2013-01-10 MED ORDER — DIPHENHYDRAMINE HCL 50 MG/ML IJ SOLN: 12.5000 mg | INTRAMUSCULAR | Status: DC | PRN

## 2013-01-10 MED ORDER — HYDROMORPHONE HCL PF 1 MG/ML IJ SOLN: 0.2500 mg | INTRAMUSCULAR | Status: DC | PRN

## 2013-01-10 MED ORDER — OXYTOCIN 40 UNITS IN LACTATED RINGERS INFUSION - SIMPLE MED: 62.5000 mL/h | INTRAVENOUS | Status: AC

## 2013-01-10 MED ORDER — OXYTOCIN 10 UNIT/ML IJ SOLN
40.0000 [IU] | INTRAVENOUS | Status: DC | PRN
  Administered 2013-01-10: 40 [IU] via INTRAVENOUS

## 2013-01-10 MED ORDER — MEPERIDINE HCL 25 MG/ML IJ SOLN
INTRAMUSCULAR | Status: AC
  Filled 2013-01-10: qty 1

## 2013-01-10 MED ORDER — CEFAZOLIN SODIUM-DEXTROSE 2-3 GM-% IV SOLR: INTRAVENOUS | Status: DC | PRN

## 2013-01-10 MED ORDER — LACTATED RINGERS IV SOLN
INTRAVENOUS | Status: DC | PRN
  Administered 2013-01-10: 01:00:00 via INTRAVENOUS

## 2013-01-10 MED ORDER — WITCH HAZEL-GLYCERIN EX PADS: 1.0000 "application " | MEDICATED_PAD | CUTANEOUS | Status: DC | PRN

## 2013-01-10 MED ORDER — DIPHENHYDRAMINE HCL 25 MG PO CAPS: 25.0000 mg | ORAL_CAPSULE | Freq: Four times a day (QID) | ORAL | Status: DC | PRN

## 2013-01-10 MED ORDER — IBUPROFEN 600 MG PO TABS
600.0000 mg | ORAL_TABLET | Freq: Four times a day (QID) | ORAL | Status: DC
  Administered 2013-01-10 – 2013-01-12 (×7): 600 mg via ORAL
  Filled 2013-01-10 (×7): qty 1

## 2013-01-10 MED ORDER — MORPHINE SULFATE 0.5 MG/ML IJ SOLN
INTRAMUSCULAR | Status: AC
  Filled 2013-01-10: qty 10

## 2013-01-10 MED ORDER — OXYTOCIN 10 UNIT/ML IJ SOLN
INTRAMUSCULAR | Status: AC
  Filled 2013-01-10: qty 4

## 2013-01-10 MED ORDER — LIDOCAINE-EPINEPHRINE (PF) 2 %-1:200000 IJ SOLN
INTRAMUSCULAR | Status: AC
  Filled 2013-01-10: qty 20

## 2013-01-10 MED ORDER — NALOXONE HCL 0.4 MG/ML IJ SOLN: 0.4000 mg | INTRAMUSCULAR | Status: DC | PRN

## 2013-01-10 SURGICAL SUPPLY — 35 items
APL SKNCLS STERI-STRIP NONHPOA (GAUZE/BANDAGES/DRESSINGS) ×1
BARRIER ADHS 3X4 INTERCEED (GAUZE/BANDAGES/DRESSINGS) ×1 IMPLANT
BENZOIN TINCTURE PRP APPL 2/3 (GAUZE/BANDAGES/DRESSINGS) ×2 IMPLANT
BRR ADH 4X3 ABS CNTRL BYND (GAUZE/BANDAGES/DRESSINGS) ×1
CLOTH BEACON ORANGE TIMEOUT ST (SAFETY) ×2 IMPLANT
DRAPE LG THREE QUARTER DISP (DRAPES) ×2 IMPLANT
DRSG OPSITE POSTOP 4X10 (GAUZE/BANDAGES/DRESSINGS) ×2 IMPLANT
DURAPREP 26ML APPLICATOR (WOUND CARE) ×2 IMPLANT
ELECT REM PT RETURN 9FT ADLT (ELECTROSURGICAL) ×2
ELECTRODE REM PT RTRN 9FT ADLT (ELECTROSURGICAL) ×1 IMPLANT
EXTRACTOR VACUUM M CUP 4 TUBE (SUCTIONS) IMPLANT
GLOVE BIO SURGEON STRL SZ 6.5 (GLOVE) IMPLANT
GLOVE BIO SURGEON STRL SZ7.5 (GLOVE) ×2 IMPLANT
GLOVE BIOGEL PI IND STRL 7.0 (GLOVE) IMPLANT
GLOVE BIOGEL PI IND STRL 8 (GLOVE) ×1 IMPLANT
GLOVE BIOGEL PI INDICATOR 7.0 (GLOVE)
GLOVE BIOGEL PI INDICATOR 8 (GLOVE) ×1
GOWN PREVENTION PLUS LG XLONG (DISPOSABLE) ×2 IMPLANT
GOWN PREVENTION PLUS XLARGE (GOWN DISPOSABLE) IMPLANT
GOWN STRL REIN XL XLG (GOWN DISPOSABLE) ×2 IMPLANT
KIT ABG SYR 3ML LUER SLIP (SYRINGE) IMPLANT
NDL HYPO 25X5/8 SAFETYGLIDE (NEEDLE) IMPLANT
NEEDLE HYPO 25X5/8 SAFETYGLIDE (NEEDLE) IMPLANT
NS IRRIG 1000ML POUR BTL (IV SOLUTION) ×2 IMPLANT
PACK C SECTION WH (CUSTOM PROCEDURE TRAY) ×2 IMPLANT
PAD OB MATERNITY 4.3X12.25 (PERSONAL CARE ITEMS) ×2 IMPLANT
SLEEVE SCD COMPRESS KNEE MED (MISCELLANEOUS) IMPLANT
STRIP CLOSURE SKIN 1/2X4 (GAUZE/BANDAGES/DRESSINGS) ×2 IMPLANT
SUT GUT PLAIN 0 CT-3 TAN 27 (SUTURE) IMPLANT
SUT MON AB 4-0 PS1 27 (SUTURE) ×2 IMPLANT
SUT PLAIN 2 0 XLH (SUTURE) ×1 IMPLANT
SUT VIC AB 0 CT1 36 (SUTURE) ×10 IMPLANT
TOWEL OR 17X24 6PK STRL BLUE (TOWEL DISPOSABLE) ×6 IMPLANT
TRAY FOLEY CATH 14FR (SET/KITS/TRAYS/PACK) IMPLANT
WATER STERILE IRR 1000ML POUR (IV SOLUTION) ×2 IMPLANT

## 2013-01-10 NOTE — OR Nursing (Signed)
50 ml blood loss during fundal massage by DLWegner RN 

## 2013-01-10 NOTE — Preoperative (Signed)
Beta Blockers   Reason not to administer Beta Blockers:Not Applicable 

## 2013-01-10 NOTE — Op Note (Signed)
Cesarean Section Procedure Note  Indications: failure to progress: arrest of descent    Pre-operative Diagnosis:   39 week 5 day pregnancy. Oligohydramnios GBS positive Tobacco use  Post-operative Diagnosis: same + delivered  Surgeon: Delbert Harness.   Assistants:CST  Anesthesia: Epidural anesthesia  ASA Class: none   Procedure Details   The patient was seen in the . The risks, benefits, complications, treatment options, and expected outcomes were discussed with the patient.  The patient concurred with the proposed plan, giving informed consent.  The patient was taken to Operating Room # 9, identified as Caitlin Simmons and the procedure verified as C-Section Delivery. A Time Out was held and the above information confirmed.  The pt had been on PCN.  After induction of anesthesia, the patient was draped and prepped in the usual sterile manner. A Pfannenstiel incision was made and carried down through the subcutaneous tissue to the fascia. Fascial incision was made and extended transversely. The fascia was separated from the underlying rectus tissue superiorly and inferiorly. The peritoneum was identified and entered. Peritoneal incision was extended longitudinally. The utero-vesical peritoneal reflection was incised transversely and the bladder flap was bluntly freed from the lower uterine segment. A low transverse uterine incision was made. Delivered from cephalic presentation was a      gram   with Apgar scores of             9 at one minute and 9 at five minutes. After the umbilical cord was clamped and cut cord blood was obtained for evaluation. The placenta was removed intact and appeared normal. The uterine outline, tubes and ovaries appeared normal.   The uterine incision was closed with running locked sutures of Vicryl and 0 vicryl with a second layer imbricating the first and the right inferior extension of the hysterotomy was approximately 5 cm and this was closed by another  suture with primary running locked closure. Hemostasis was observed. Lavage was carried out until clear of the upper abdomen. Hysterotomy was then confirmed to be hemostatic and the uterus returned to the abdomen.  The two previously placed laps in the pericolic gutters were removed.  A piece of interceed was placed in a T shaped fashion on the uterus.  The subfascial tissues were noted to be hemostatic.  The fascia was then reapproximated with running sutures of 0 vicryl with 2 sutures tied at the right 1/3 margin Monocryl. The subcutaneous tissues were irrigated and cleared of all clot and debris and hemostasis assured.   The skin was reapproximated with Monocryl in a sucuticular fashion.  Instrument, sponge, and needle counts were correct prior the abdominal closure and at the conclusion of the case.   Findings: Well developed LUS and normal ovaries and tubes  The baby was vigorous and did have a 2 cm superficial incision on the cheek bone. Normal placenta and cord  Good hemostasis and fascial integrity  Estimated Blood Loss:  700         Drains: foley to gravity  UO 300 ml         Total IV Fluids:  2700 ml         Specimens: Placenta          Implants: interceed         Complications:  None; patient tolerated the procedure well.         Disposition: PACU - hemodynamically stable.         Condition: stable  Attending Attestation:  I performed the procedure.

## 2013-01-10 NOTE — Progress Notes (Signed)
Patient ID: Caitlin Simmons, female   DOB: Feb 14, 1981, 32 y.o.   MRN: [id number] S. Pushing x 2.5 hours with out change past c/c/+1 with molding and capu Still with good pain relief  O. Temp:  [97.7 F (36.5 C)-98.9 F (37.2 C)] 98.3 F (36.8 C) (02/09 2331) Pulse Rate:  [57-91] 74 (02/10 0032) Resp:  [16-20] 16 (02/10 0001) BP: (85-131)/(46-79) 107/50 mmHg (02/10 0032) SpO2:  [95 %-100 %] 97 % (02/09 1737)  FHR reassuring for the second stage with reactive accels  PIT at 7    A. 2nd stage arrest (of descent Prolonged ROM with GBS positive on pcn Oligo tobacco P. Proceed with cesarean delivery

## 2013-01-10 NOTE — Consult Note (Signed)
Neonatology Note:   Attendance at C-section:    I was asked to attend this primary C/S at term due to failure of descent. The mother is a G1P0 O pos, GBS pos on Pen G for 24 hours prior to delivery. ROM 36 hours prior to delivery, fluid clear. The mother was afebrile during labor.  Infant vigorous with good spontaneous cry and tone. Needed only minimal bulb suctioning. Ap 9/9. Lungs clear to ausc in DR. To CN to care of Pediatrician.   Deatra James, MD

## 2013-01-10 NOTE — Transfer of Care (Signed)
Immediate Anesthesia Transfer of Care Note  Patient: Caitlin Simmons  Procedure(s) Performed: Procedure(s): CESAREAN SECTION of baby boy at 59  APGAR 9/9 (N/A)  Patient Location: PACU  Anesthesia Type:Epidural  Level of Consciousness: awake, alert  and oriented  Airway & Oxygen Therapy: Patient Spontanous Breathing  Post-op Assessment: Report given to PACU RN  Post vital signs: Reviewed  Complications: No apparent anesthesia complications

## 2013-01-10 NOTE — Anesthesia Postprocedure Evaluation (Signed)
  Anesthesia Post-op Note  Patient: Caitlin Simmons  Procedure(s) Performed: Procedure(s): CESAREAN SECTION of baby boy at 23  APGAR 9/9 (N/A)  Patient Location: Mother/Baby  Anesthesia Type:Epidural  Level of Consciousness: awake, alert  and oriented  Airway and Oxygen Therapy: breathing spontaneously  Post-op Pain: none  Post-op Assessment: Post-op Vital signs reviewed and Patient's Cardiovascular Status Stable  Post-op Vital Signs: Reviewed and stable  Complications: No apparent anesthesia complications

## 2013-01-10 NOTE — Anesthesia Postprocedure Evaluation (Signed)
Anesthesia Post Note  Patient: Caitlin Simmons  Procedure(s) Performed: Procedure(s) (LRB): CESAREAN SECTION of baby boy at 45  APGAR 9/9 (N/A)  Anesthesia type: Epidural  Patient location: PACU  Post pain: Pain level controlled  Post assessment: Post-op Vital signs reviewed  Last Vitals:  Filed Vitals:   01/10/13 0245  BP: 118/103  Pulse: 97  Temp:   Resp: 20    Post vital signs: Reviewed  Level of consciousness: awake  Complications: No apparent anesthesia complications

## 2013-01-11 ENCOUNTER — Encounter (HOSPITAL_COMMUNITY): Payer: Self-pay | Admitting: Obstetrics & Gynecology

## 2013-01-11 NOTE — Progress Notes (Signed)
Subjective: Postpartum Day 1: Cesarean Delivery Patient reports tolerating PO and no problems voiding.    Objective: Vital signs in last 24 hours: Temp:  [98.1 F (36.7 C)-99.4 F (37.4 C)] 98.1 F (36.7 C) (02/11 0543) Pulse Rate:  [58-102] 86 (02/11 0543) Resp:  [18-20] 18 (02/11 0543) BP: (85-116)/(53-98) 105/73 mmHg (02/11 0543) SpO2:  [94 %-98 %] 96 % (02/11 0245)  Physical Exam:  General: alert and cooperative Lochia: appropriate Uterine Fundus: firm Incision: healing well DVT Evaluation: No evidence of DVT seen on physical exam.   Recent Labs  01/08/13 1530 01/10/13 1202  HGB 14.0 10.8*  HCT 40.9 31.9*    Assessment/Plan: Status post Cesarean section. Doing well postoperatively. pt desired circumcision of infant. R/ba were discussed including but not limited to infection / bleeding/ damage to penis and poor cosmesis with need for further surgery. Pt and father of baby voiced understanding and desire to proceed.   Continue current care.  [name] 01/11/2013, 11:17 AM

## 2013-01-11 NOTE — Progress Notes (Signed)
UR chart review completed.  

## 2013-01-12 MED ORDER — OXYCODONE-ACETAMINOPHEN 5-325 MG PO TABS: 1.0000 | ORAL_TABLET | Freq: Four times a day (QID) | ORAL | Status: DC | PRN

## 2013-01-12 MED ORDER — IBUPROFEN 600 MG PO TABS: 600.0000 mg | ORAL_TABLET | Freq: Four times a day (QID) | ORAL | Status: DC | PRN

## 2013-01-12 NOTE — Discharge Summary (Signed)
Obstetric Discharge Summary Reason for Admission: rupture of membranes Prenatal Procedures: none Intrapartum Procedures: cesarean: low cervical, transverse Postpartum Procedures: none Complications-Operative and Postpartum: none Hemoglobin  Date Value Range Status  01/10/2013 10.8* 12.0 - 15.0 g/dL Final     DELTA CHECK NOTED     REPEATED TO VERIFY     HCT  Date Value Range Status  01/10/2013 31.9* 36.0 - 46.0 % Final    Physical Exam:  General: alert and cooperative Lochia: appropriate Uterine Fundus: firm Incision: healing well DVT Evaluation: No evidence of DVT seen on physical exam.  Discharge Diagnoses: Term Pregnancy-delivered  Discharge Information: Date: 01/12/2013 Activity: pelvic rest Diet: routine Medications: PNV, Ibuprofen and Percocet Condition: stable Instructions: refer to practice specific booklet Discharge to: home Follow-up Information   Follow up with Jessee Avers., MD. Schedule an appointment as soon as possible for a visit in 2 weeks. (incision check )    Contact information:   [address] [place] [address] [phone]       Newborn Data: Live born female  Birth Weight: 8 lb 3.6 oz (3730 g) APGAR: 9, 9  Home with mother.  Philopater Mucha J. 01/12/2013, 8:18 AM

## 2013-01-18 ENCOUNTER — Inpatient Hospital Stay (HOSPITAL_COMMUNITY): Admission: RE | Admit: 2013-01-18 | Payer: 59 | Source: Ambulatory Visit

## 2014-10-02 ENCOUNTER — Encounter (HOSPITAL_COMMUNITY): Payer: Self-pay | Admitting: Obstetrics & Gynecology

## 2015-11-14 LAB — OB RESULTS CONSOLE GBS: GBS: POSITIVE

## 2015-11-14 LAB — OB RESULTS CONSOLE GC/CHLAMYDIA
Chlamydia: NEGATIVE
Gonorrhea: NEGATIVE

## 2015-11-14 LAB — OB RESULTS CONSOLE ABO/RH: RH TYPE: POSITIVE

## 2015-11-14 LAB — OB RESULTS CONSOLE HIV ANTIBODY (ROUTINE TESTING): HIV: NONREACTIVE

## 2015-11-14 LAB — OB RESULTS CONSOLE RPR: RPR: NONREACTIVE

## 2015-11-14 LAB — OB RESULTS CONSOLE ANTIBODY SCREEN: ANTIBODY SCREEN: NEGATIVE

## 2015-11-14 LAB — OB RESULTS CONSOLE RUBELLA ANTIBODY, IGM: Rubella: IMMUNE

## 2015-11-14 LAB — OB RESULTS CONSOLE HEPATITIS B SURFACE ANTIGEN: HEP B S AG: NEGATIVE

## 2016-04-24 ENCOUNTER — Other Ambulatory Visit: Payer: Self-pay | Admitting: Obstetrics and Gynecology

## 2016-05-07 ENCOUNTER — Encounter (HOSPITAL_COMMUNITY): Payer: Self-pay

## 2016-05-07 ENCOUNTER — Telehealth (HOSPITAL_COMMUNITY): Payer: Self-pay | Admitting: *Deleted

## 2016-05-07 NOTE — Telephone Encounter (Signed)
Preadmission screen  

## 2016-05-08 ENCOUNTER — Encounter (HOSPITAL_COMMUNITY): Payer: Self-pay

## 2016-05-12 NOTE — Patient Instructions (Signed)
[address]  05/12/2016   Your procedure is scheduled on:  05/22/16  Enter through the [facility]tr[facility] of [facility] at 1100AM.  Pick up the phone at the desk and dial [phone].   Call this number if you have problems the morning of surgery: [phone]   Remember:   Do not eat food:After Midnight.  Do not drink clear liquids: After Midnight.  Take these medicines the morning of surgery with A SIP OF WATER: none    Do not wear jewelry, make-up or nail polish.  Do not wear lotions, powders, or perfumes. You may wear deodorant.  Do not shave 48 hours prior to surgery.  Do not bring valuables to the hospital.  [facility] is not   responsible for any belongings or valuables brought to the hospital.  Contacts, dentures or bridgework may not be worn into surgery.  Leave suitcase in the car. After surgery it may be brought to your room.  For patients admitted to the hospital, checkout time is 11:00 AM the day of              discharge.   Patients discharged the day of surgery will not be allowed to drive             home.  Name and phone number of your driver: na  Special Instructions:   Shower using CHG 2 nights before surgery and the night before surgery.  If you shower the day of surgery use CHG.  Use special wash - you have one bottle of CHG for all showers.  You should use approximately 1/3 of the bottle for each shower.   Please read over the following fact sheets that you were given:   Surgical Site Infection Prevention

## 2016-05-15 NOTE — H&P (Signed)
Caitlin Simmons is a 35 y.o. female, G2P1001 at 39.0 weeks, presenting for scheduled repeat cesarean section on 05/22/16.   BMI  34.6 Posterior placenta    Patient Active Problem List   Diagnosis Date Noted  . History of cesarean section, low transverse 05/20/2016  . 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 Light cigarette smoker 05/20/2016  . Obesity (BMI 30.0-34.9) 05/20/2016    History of present pregnancy: Patient entered care at 11.6 weeks.   EDC of 05/29/16 was established by LMP of 08/23/15.    Anatomy scan:  19.5 weeks on 01/08/16, with normal findings and an Posterior  placenta.   S=D NORMAL ANATOMY. FIFTH DIGIT AND HEART VIEWS NOT SEEN   Additional [place] evaluations:    13.5 wks NT 11/27/15:   SIUP, posterior placenta, anteverted uterus, NT=1.5 mm, amnion seen. Ovaries and adnexas wnl  23.5 wks F/u anatomy 02/05/16: SIUP, NORMAL FLUID, GROWTH 41%, CX 5.73 CM, ANATOMY COMPLETE   Significant prenatal events:  First Trimester: upper respiratory infection requiring Z=pack Second Trimester:   Considering TOLAC, undecided.  C/o sciatica pain Third Trimester: Decided to have RCS, Encouraged to quit smoking. C/o mild swelling of feet/ankles   Last evaluation:  38.5 wks on 05/20/16 by S. DevaneJohnson,CNM;  FH 40cm, Vertex, 140 bpm, +FM, BP 100/60;   199 Lbs ; ROB Pt is w/o complaints Pt denies MAU Visit CM/CMA A1. 35 yo G2p1001 at 38 5/7 weeks 2. ROB 3. +GBS 4. previous c/s P1. repeat c/s 05-22-16 2. Labor reviewed 3. All questions answered 4. RTC 6 weeks PP check   OB History    Gravida Para Term Preterm AB TAB SAB Ectopic Multiple Living   01/10/2013, 40 wks, M , 8lbs 3oz, LTCS  Past Medical History  Diagnosis Date  . Abnormal Pap smear   . Hx of varicella   . Vaginal Pap smear, abnormal   . Headache   . Scoliosis    Past Surgical History  Procedure Laterality Date  . Colposcopy    . Cesarean section N/A 01/10/2013    Procedure: CESAREAN SECTION of baby boy at 0125   APGAR 9/9;  Surgeon: Delbert Harness, MD;  Location: [facility];  Service: Obstetrics;  Laterality: N/A;   Family History: family history includes Heart attack in her mother; Hypertension in her mother. There is no history of Other. Social History:  reports that she has been smoking Cigarettes.  She has been smoking about 0.10 packs per day. She has never used smokeless tobacco. She reports that she does not drink alcohol or use illicit drugs.  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 She is separated and identifies as heterosexual.   Prenatal Transfer Tool  Maternal Diabetes: No Genetic Screening: Normal Maternal Ultrasounds/Referrals: Normal Fetal Ultrasounds or other Referrals:  None Maternal Substance Abuse:  No Significant Maternal Medications:  None Significant Maternal Lab Results: Lab values include: Group B Strep positive  TDAP 03/04/16 Flu declined  ROS:  All systems reviewed and wnl except as indicated in HPI   No Known Allergies     Last menstrual period 08/23/2015, unknown if currently breastfeeding.  Chest clear Heart RRR without murmur Abd gravid, NT, FH 40cm Ext: wnl  FHR: Category 1; 140 bpm UCs:  none  Prenatal labs: ABO, Rh: O/Positive/-- (12/14 0000) Antibody: Negative (12/14 0000) Rubella:  !Error!    immune RPR: Nonreactive (12/14  0000)  HBsAg: Negative (12/14 0000)  HIV: Non-reactive (12/14 0000)  GBS: Positive (12/14 0000) Sickle cell/Hgb electrophoresis:  N/A Pap:  Wnl. 11/20/15 GC:  Negative, 11/14/15 Chlamydia:  Negative, 11/14/15 Genetic screenings:  Wnl, AFP Glucola:  Wnl, 78 Other:   Hgb 13.3 at NOB, 12.4 at 28 weeks   Assessment/Plan: IUP at 39.0 wks Declines, TOLAC, desires Repeat Cesarean  Hx Cesarean section x 1  Light smokes, 0.10 pack/day Obesity, BMI 34.6 GBS postive Cat FT   [facility] Scheduled Repeat Cesarean Section @1230pm  with Dr. Estanislado Pandyivard Routine [facility] orders  Beatrix Fettersachel  StallCNM, [place] 05/15/2016, 9:31 PM

## 2016-05-20 DIAGNOSIS — Z22330 Carrier of Group B streptococcus: Secondary | ICD-10-CM

## 2016-05-20 DIAGNOSIS — E669 Obesity, unspecified: Secondary | ICD-10-CM

## 2016-05-20 DIAGNOSIS — F17210 Nicotine dependence, cigarettes, uncomplicated: Secondary | ICD-10-CM

## 2016-05-20 DIAGNOSIS — Z98891 History of uterine scar from previous surgery: Secondary | ICD-10-CM

## 2016-05-21 ENCOUNTER — Encounter (HOSPITAL_COMMUNITY)
Admission: RE | Admit: 2016-05-21 | Discharge: 2016-05-21 | Disposition: A | Discharge status: Home / Self Care | Payer: Medicaid Other | Source: Ambulatory Visit | Attending: Obstetrics and Gynecology | Admitting: Obstetrics and Gynecology

## 2016-05-21 HISTORY — DX: Unspecified abnormal cytological findings in specimens from vagina: R87.629

## 2016-05-21 HISTORY — DX: Scoliosis, unspecified: M41.9

## 2016-05-21 HISTORY — DX: Headache, unspecified: R51.9

## 2016-05-21 HISTORY — DX: Personal history of other infectious and parasitic diseases: Z86.19

## 2016-05-21 HISTORY — DX: Headache: R51

## 2016-05-21 LAB — CBC
HCT: 38.2 % (ref 36.0–46.0)
Hemoglobin: 13.5 g/dL (ref 12.0–15.0)
MCH: 31.5 pg (ref 26.0–34.0)
MCHC: 35.3 g/dL (ref 30.0–36.0)
MCV: 89.3 fL (ref 78.0–100.0)
PLATELETS: 136 10*3/uL — AB (ref 150–400)
RBC: 4.28 MIL/uL (ref 3.87–5.11)
RDW: 13.5 % (ref 11.5–15.5)
WBC: 12.2 10*3/uL — ABNORMAL HIGH (ref 4.0–10.5)

## 2016-05-21 LAB — TYPE AND SCREEN
ABO/RH(D): O POS
Antibody Screen: NEGATIVE

## 2016-05-21 NOTE — H&P (Signed)
Caitlin Simmons is a 35 y.o. female G2P1 at 2339 weeks  presenting for repeat cesarean section. Confirmed EDD 05/29/16  Pregnancy followed at [facility] since 11+6  weeks and remarkable for:  1. Previous cesarean section for failure to descend 2. GBS + in urine 3. Smoker  OB History    Gravida Para Term Preterm AB TAB SAB Ectopic Multiple Living   2 1 1       1      Past Medical History  Diagnosis Date  . Abnormal Pap smear   . Hx of varicella   . Vaginal Pap smear, abnormal   . Headache   . Scoliosis    Past Surgical History  Procedure Laterality Date  . Colposcopy    . Cesarean section N/A 01/10/2013    Procedure: CESAREAN SECTION of baby boy at 0125  APGAR 9/9;  Surgeon: Delbert Harnessaniel H Moore, MD;  Location: [facility];  Service: Obstetrics;  Laterality: N/A;    Family History:   family history includes Heart attack in her mother; Hypertension in her mother. There is no history of Other. Social History:    reports that she has been smoking Cigarettes.  She has been smoking about 0.10 packs per day. She has never used smokeless tobacco. She reports that she does not drink alcohol or use illicit drugs.   Prenatal labs: ABO, Rh: --/--/O POS (06/21 0945) Antibody: NEG (06/21 0945) Rubella: immune RPR: Nonreactive (12/14 0000)  HBsAg: Negative (12/14 0000)  HIV: Non-reactive (12/14 0000)  GBS: Positive (12/14 0000)    Prenatal Transfer Tool  Maternal Diabetes: No Genetic Screening: Normal Maternal Ultrasounds/Referrals: Normal Fetal Ultrasounds or other Referrals:  None Maternal Substance Abuse:  No Significant Maternal Medications:  None Significant Maternal Lab Results: None     PHYSICAL EXAM:  General Appearance: Alert, appropriate appearance for age. No acute distress HEENT Exam: Grossly normal Chest/Respiratory Exam: Normal chest wall and respirations. Clear to auscultation  Cardiovascular Exam: Regular rate and rhythm. S1, S2, no murmur Gastrointestinal Exam: soft,  non-tender, Uterus gravid with size compatible with GA, Vertex presentation by Leopold's maneuvers Psychiatric Exam: Alert and oriented, appropriate affect  ++++++++++++++++++++++++++++++++++++++++++++++++++++++++++++++++   Assessment/Plan:  G2P1 at 39 weeks for repeat cesarean section  Cesarean section reviewed with pt with R&B including but not limited to:  bleeding, infection, injury to other organs. Low transverse approach planned which will allow vaginal delivery with future pregnancies. Should a vertical incision or inverted T be needed, patient is aware that repeat cesarean sections would be recommended in the future. Expected hospital stay and recovery also discussed.    Silverio LaySandra Caron Tardif MD 05/21/2016, 6:19 PM

## 2016-05-22 ENCOUNTER — Inpatient Hospital Stay (HOSPITAL_COMMUNITY): Payer: Medicaid Other | Admitting: Anesthesiology

## 2016-05-22 ENCOUNTER — Encounter (HOSPITAL_COMMUNITY): Payer: Self-pay

## 2016-05-22 ENCOUNTER — Inpatient Hospital Stay (HOSPITAL_COMMUNITY)
Admission: RE | Admit: 2016-05-22 | Discharge: 2016-05-24 | DRG: 766 | Disposition: A | Discharge status: Home / Self Care | Payer: Medicaid Other | Source: Ambulatory Visit | Attending: Obstetrics and Gynecology | Admitting: Obstetrics and Gynecology

## 2016-05-22 ENCOUNTER — Encounter (HOSPITAL_COMMUNITY)
Admission: RE | Disposition: A | Discharge status: Home / Self Care | Payer: Self-pay | Source: Ambulatory Visit | Attending: Obstetrics and Gynecology

## 2016-05-22 DIAGNOSIS — Z6830 Body mass index (BMI) 30.0-30.9, adult: Secondary | ICD-10-CM | POA: Diagnosis not present

## 2016-05-22 DIAGNOSIS — Z98891 History of uterine scar from previous surgery: Secondary | ICD-10-CM

## 2016-05-22 DIAGNOSIS — O34211 Maternal care for low transverse scar from previous cesarean delivery: Principal | ICD-10-CM | POA: Diagnosis present

## 2016-05-22 DIAGNOSIS — E669 Obesity, unspecified: Secondary | ICD-10-CM

## 2016-05-22 DIAGNOSIS — O9081 Anemia of the puerperium: Secondary | ICD-10-CM | POA: Diagnosis present

## 2016-05-22 DIAGNOSIS — O99824 Streptococcus B carrier state complicating childbirth: Secondary | ICD-10-CM | POA: Diagnosis present

## 2016-05-22 DIAGNOSIS — Z3A39 39 weeks gestation of pregnancy: Secondary | ICD-10-CM

## 2016-05-22 DIAGNOSIS — Z22330 Carrier of Group B streptococcus: Secondary | ICD-10-CM

## 2016-05-22 DIAGNOSIS — M419 Scoliosis, unspecified: Secondary | ICD-10-CM | POA: Diagnosis present

## 2016-05-22 DIAGNOSIS — O99334 Smoking (tobacco) complicating childbirth: Secondary | ICD-10-CM | POA: Diagnosis present

## 2016-05-22 DIAGNOSIS — F17210 Nicotine dependence, cigarettes, uncomplicated: Secondary | ICD-10-CM | POA: Diagnosis present

## 2016-05-22 DIAGNOSIS — D649 Anemia, unspecified: Secondary | ICD-10-CM | POA: Diagnosis present

## 2016-05-22 DIAGNOSIS — O99214 Obesity complicating childbirth: Secondary | ICD-10-CM | POA: Diagnosis present

## 2016-05-22 DIAGNOSIS — Z8249 Family history of ischemic heart disease and other diseases of the circulatory system: Secondary | ICD-10-CM

## 2016-05-22 LAB — RPR: RPR: NONREACTIVE

## 2016-05-22 SURGERY — Surgical Case
Anesthesia: Spinal

## 2016-05-22 MED ORDER — EPHEDRINE 5 MG/ML INJ
INTRAVENOUS | Status: AC
  Filled 2016-05-22: qty 10

## 2016-05-22 MED ORDER — BUPIVACAINE HCL (PF) 0.25 % IJ SOLN
INTRAMUSCULAR | Status: AC
  Filled 2016-05-22: qty 20

## 2016-05-22 MED ORDER — ONDANSETRON HCL 4 MG/2ML IJ SOLN
INTRAMUSCULAR | Status: DC | PRN
  Administered 2016-05-22: 4 mg via INTRAVENOUS

## 2016-05-22 MED ORDER — LACTATED RINGERS IV SOLN
Freq: Once | INTRAVENOUS | Status: AC
  Administered 2016-05-22: 1000 mL/h via INTRAVENOUS
  Administered 2016-05-22 (×2): via INTRAVENOUS

## 2016-05-22 MED ORDER — SIMETHICONE 80 MG PO CHEW
80.0000 mg | CHEWABLE_TABLET | Freq: Three times a day (TID) | ORAL | Status: DC
  Administered 2016-05-22 – 2016-05-23 (×4): 80 mg via ORAL
  Filled 2016-05-22 (×4): qty 1

## 2016-05-22 MED ORDER — MEPERIDINE HCL 25 MG/ML IJ SOLN: 6.2500 mg | INTRAMUSCULAR | Status: DC | PRN

## 2016-05-22 MED ORDER — SIMETHICONE 80 MG PO CHEW: 80.0000 mg | CHEWABLE_TABLET | ORAL | Status: DC | PRN

## 2016-05-22 MED ORDER — LACTATED RINGERS IV SOLN
INTRAVENOUS | Status: DC
  Administered 2016-05-22: 13:00:00 via INTRAVENOUS

## 2016-05-22 MED ORDER — NALBUPHINE HCL 10 MG/ML IJ SOLN: 5.0000 mg | Freq: Once | INTRAMUSCULAR | Status: DC | PRN

## 2016-05-22 MED ORDER — KETOROLAC TROMETHAMINE 30 MG/ML IJ SOLN
INTRAMUSCULAR | Status: AC
  Filled 2016-05-22: qty 1

## 2016-05-22 MED ORDER — SCOPOLAMINE 1 MG/3DAYS TD PT72
1.0000 | MEDICATED_PATCH | Freq: Once | TRANSDERMAL | Status: DC
  Administered 2016-05-22: 1.5 mg via TRANSDERMAL

## 2016-05-22 MED ORDER — MENTHOL 3 MG MT LOZG: 1.0000 | LOZENGE | OROMUCOSAL | Status: DC | PRN

## 2016-05-22 MED ORDER — PHENYLEPHRINE 8 MG IN D5W 100 ML (0.08MG/ML) PREMIX OPTIME
INJECTION | INTRAVENOUS | Status: AC
  Filled 2016-05-22: qty 100

## 2016-05-22 MED ORDER — DIPHENHYDRAMINE HCL 50 MG/ML IJ SOLN: 12.5000 mg | INTRAMUSCULAR | Status: DC | PRN

## 2016-05-22 MED ORDER — FENTANYL CITRATE (PF) 100 MCG/2ML IJ SOLN
INTRAMUSCULAR | Status: DC | PRN
  Administered 2016-05-22: 12.5 ug via INTRATHECAL

## 2016-05-22 MED ORDER — DIPHENHYDRAMINE HCL 25 MG PO CAPS
25.0000 mg | ORAL_CAPSULE | ORAL | Status: DC | PRN
  Filled 2016-05-22: qty 1

## 2016-05-22 MED ORDER — KETOROLAC TROMETHAMINE 30 MG/ML IJ SOLN: 30.0000 mg | Freq: Four times a day (QID) | INTRAMUSCULAR | Status: AC | PRN

## 2016-05-22 MED ORDER — WITCH HAZEL-GLYCERIN EX PADS: 1.0000 "application " | MEDICATED_PAD | CUTANEOUS | Status: DC | PRN

## 2016-05-22 MED ORDER — OXYTOCIN 40 UNITS IN LACTATED RINGERS INFUSION - SIMPLE MED: 2.5000 [IU]/h | INTRAVENOUS | Status: AC

## 2016-05-22 MED ORDER — ONDANSETRON HCL 4 MG/2ML IJ SOLN: 4.0000 mg | Freq: Three times a day (TID) | INTRAMUSCULAR | Status: DC | PRN

## 2016-05-22 MED ORDER — NALBUPHINE HCL 10 MG/ML IJ SOLN: 5.0000 mg | INTRAMUSCULAR | Status: DC | PRN

## 2016-05-22 MED ORDER — FENTANYL CITRATE (PF) 100 MCG/2ML IJ SOLN: 25.0000 ug | INTRAMUSCULAR | Status: DC | PRN

## 2016-05-22 MED ORDER — MEASLES, MUMPS & RUBELLA VAC ~~LOC~~ INJ
0.5000 mL | INJECTION | Freq: Once | SUBCUTANEOUS | Status: DC
  Filled 2016-05-22: qty 0.5

## 2016-05-22 MED ORDER — METHYLERGONOVINE MALEATE 0.2 MG/ML IJ SOLN: 0.2000 mg | INTRAMUSCULAR | Status: DC | PRN

## 2016-05-22 MED ORDER — METHYLERGONOVINE MALEATE 0.2 MG PO TABS: 0.2000 mg | ORAL_TABLET | ORAL | Status: DC | PRN

## 2016-05-22 MED ORDER — ACETAMINOPHEN 325 MG PO TABS: 650.0000 mg | ORAL_TABLET | ORAL | Status: DC | PRN

## 2016-05-22 MED ORDER — LACTATED RINGERS IV SOLN: INTRAVENOUS | Status: DC

## 2016-05-22 MED ORDER — FERROUS SULFATE 325 (65 FE) MG PO TABS
325.0000 mg | ORAL_TABLET | Freq: Two times a day (BID) | ORAL | Status: DC
  Administered 2016-05-23 (×2): 325 mg via ORAL
  Filled 2016-05-22 (×3): qty 1

## 2016-05-22 MED ORDER — DEXAMETHASONE SODIUM PHOSPHATE 4 MG/ML IJ SOLN
INTRAMUSCULAR | Status: AC
  Filled 2016-05-22: qty 1

## 2016-05-22 MED ORDER — OXYTOCIN 10 UNIT/ML IJ SOLN
40.0000 [IU] | INTRAVENOUS | Status: DC | PRN
  Administered 2016-05-22: 40 [IU] via INTRAVENOUS

## 2016-05-22 MED ORDER — SCOPOLAMINE 1 MG/3DAYS TD PT72
MEDICATED_PATCH | TRANSDERMAL | Status: AC
  Administered 2016-05-22: 1.5 mg via TRANSDERMAL
  Filled 2016-05-22: qty 1

## 2016-05-22 MED ORDER — SODIUM CHLORIDE 0.9% FLUSH: 3.0000 mL | INTRAVENOUS | Status: DC | PRN

## 2016-05-22 MED ORDER — FENTANYL CITRATE (PF) 100 MCG/2ML IJ SOLN
INTRAMUSCULAR | Status: AC
  Filled 2016-05-22: qty 2

## 2016-05-22 MED ORDER — BUPIVACAINE HCL (PF) 0.25 % IJ SOLN
INTRAMUSCULAR | Status: DC | PRN
  Administered 2016-05-22: 20 mL

## 2016-05-22 MED ORDER — SCOPOLAMINE 1 MG/3DAYS TD PT72: 1.0000 | MEDICATED_PATCH | Freq: Once | TRANSDERMAL | Status: DC

## 2016-05-22 MED ORDER — DIBUCAINE 1 % RE OINT: 1.0000 "application " | TOPICAL_OINTMENT | RECTAL | Status: DC | PRN

## 2016-05-22 MED ORDER — IBUPROFEN 600 MG PO TABS
600.0000 mg | ORAL_TABLET | Freq: Four times a day (QID) | ORAL | Status: DC
  Administered 2016-05-23 – 2016-05-24 (×6): 600 mg via ORAL
  Filled 2016-05-22 (×7): qty 1

## 2016-05-22 MED ORDER — COCONUT OIL OIL: 1.0000 "application " | TOPICAL_OIL | Status: DC | PRN

## 2016-05-22 MED ORDER — OXYCODONE-ACETAMINOPHEN 5-325 MG PO TABS: 2.0000 | ORAL_TABLET | ORAL | Status: DC | PRN

## 2016-05-22 MED ORDER — PRENATAL MULTIVITAMIN CH
1.0000 | ORAL_TABLET | Freq: Every day | ORAL | Status: DC
  Administered 2016-05-23: 1 via ORAL
  Filled 2016-05-22: qty 1

## 2016-05-22 MED ORDER — OXYTOCIN 10 UNIT/ML IJ SOLN
INTRAMUSCULAR | Status: AC
  Filled 2016-05-22: qty 4

## 2016-05-22 MED ORDER — SENNOSIDES-DOCUSATE SODIUM 8.6-50 MG PO TABS
2.0000 | ORAL_TABLET | ORAL | Status: DC
  Administered 2016-05-23 – 2016-05-24 (×2): 2 via ORAL
  Filled 2016-05-22 (×2): qty 2

## 2016-05-22 MED ORDER — NALOXONE HCL 0.4 MG/ML IJ SOLN: 0.4000 mg | INTRAMUSCULAR | Status: DC | PRN

## 2016-05-22 MED ORDER — ZOLPIDEM TARTRATE 5 MG PO TABS: 5.0000 mg | ORAL_TABLET | Freq: Every evening | ORAL | Status: DC | PRN

## 2016-05-22 MED ORDER — BUPIVACAINE IN DEXTROSE 0.75-8.25 % IT SOLN
INTRATHECAL | Status: DC | PRN
  Administered 2016-05-22: 1.4 mL via INTRATHECAL

## 2016-05-22 MED ORDER — OXYCODONE-ACETAMINOPHEN 5-325 MG PO TABS
1.0000 | ORAL_TABLET | ORAL | Status: DC | PRN
Start: 2016-05-22 — End: 2016-05-24
  Administered 2016-05-23 – 2016-05-24 (×3): 1 via ORAL
  Filled 2016-05-22 (×3): qty 1

## 2016-05-22 MED ORDER — TETANUS-DIPHTH-ACELL PERTUSSIS 5-2.5-18.5 LF-MCG/0.5 IM SUSP: 0.5000 mL | Freq: Once | INTRAMUSCULAR | Status: DC

## 2016-05-22 MED ORDER — DEXAMETHASONE SODIUM PHOSPHATE 4 MG/ML IJ SOLN
INTRAMUSCULAR | Status: DC | PRN
  Administered 2016-05-22: 4 mg via INTRAVENOUS

## 2016-05-22 MED ORDER — CEFAZOLIN SODIUM-DEXTROSE 2-4 GM/100ML-% IV SOLN
2.0000 g | INTRAVENOUS | Status: AC
  Administered 2016-05-22: 2 g via INTRAVENOUS

## 2016-05-22 MED ORDER — PHENYLEPHRINE 8 MG IN D5W 100 ML (0.08MG/ML) PREMIX OPTIME
INJECTION | INTRAVENOUS | Status: DC | PRN
  Administered 2016-05-22: 60 ug/min via INTRAVENOUS

## 2016-05-22 MED ORDER — MORPHINE SULFATE (PF) 0.5 MG/ML IJ SOLN
INTRAMUSCULAR | Status: DC | PRN
  Administered 2016-05-22: .1 mg via INTRATHECAL

## 2016-05-22 MED ORDER — SIMETHICONE 80 MG PO CHEW
80.0000 mg | CHEWABLE_TABLET | ORAL | Status: DC
  Administered 2016-05-24: 80 mg via ORAL
  Filled 2016-05-22 (×2): qty 1

## 2016-05-22 MED ORDER — MORPHINE SULFATE (PF) 0.5 MG/ML IJ SOLN
INTRAMUSCULAR | Status: AC
  Filled 2016-05-22: qty 10

## 2016-05-22 MED ORDER — ONDANSETRON HCL 4 MG/2ML IJ SOLN
INTRAMUSCULAR | Status: AC
  Filled 2016-05-22: qty 2

## 2016-05-22 MED ORDER — NALOXONE HCL 2 MG/2ML IJ SOSY
1.0000 ug/kg/h | PREFILLED_SYRINGE | INTRAMUSCULAR | Status: DC | PRN
  Filled 2016-05-22: qty 2

## 2016-05-22 MED ORDER — KETOROLAC TROMETHAMINE 30 MG/ML IJ SOLN
30.0000 mg | Freq: Four times a day (QID) | INTRAMUSCULAR | Status: AC | PRN
  Administered 2016-05-22: 30 mg via INTRAMUSCULAR

## 2016-05-22 MED ORDER — DIPHENHYDRAMINE HCL 25 MG PO CAPS: 25.0000 mg | ORAL_CAPSULE | Freq: Four times a day (QID) | ORAL | Status: DC | PRN

## 2016-05-22 SURGICAL SUPPLY — 38 items
APL SKNCLS STERI-STRIP NONHPOA (GAUZE/BANDAGES/DRESSINGS) ×1
BENZOIN TINCTURE PRP APPL 2/3 (GAUZE/BANDAGES/DRESSINGS) ×3 IMPLANT
BOOTIES KNEE HIGH SLOAN (MISCELLANEOUS) ×6 IMPLANT
CLAMP CORD UMBIL (MISCELLANEOUS) IMPLANT
CLOSURE STERI STRIP 1/2 X4 (GAUZE/BANDAGES/DRESSINGS) ×1 IMPLANT
CLOSURE WOUND 1/2 X4 (GAUZE/BANDAGES/DRESSINGS) ×1
CLOTH BEACON ORANGE TIMEOUT ST (SAFETY) ×3 IMPLANT
DRAIN JACKSON PRT FLT 10 (DRAIN) IMPLANT
DRSG OPSITE POSTOP 4X10 (GAUZE/BANDAGES/DRESSINGS) ×3 IMPLANT
DURAPREP 26ML APPLICATOR (WOUND CARE) ×3 IMPLANT
ELECT REM PT RETURN 9FT ADLT (ELECTROSURGICAL) ×3
ELECTRODE REM PT RTRN 9FT ADLT (ELECTROSURGICAL) ×1 IMPLANT
EVACUATOR SILICONE 100CC (DRAIN) IMPLANT
EXTRACTOR VACUUM M CUP 4 TUBE (SUCTIONS) IMPLANT
EXTRACTOR VACUUM M CUP 4' TUBE (SUCTIONS)
GLOVE BIOGEL PI IND STRL 7.0 (GLOVE) ×2 IMPLANT
GLOVE BIOGEL PI INDICATOR 7.0 (GLOVE) ×4
GLOVE ECLIPSE 6.5 STRL STRAW (GLOVE) ×3 IMPLANT
GOWN STRL REUS W/TWL LRG LVL3 (GOWN DISPOSABLE) ×6 IMPLANT
KIT ABG SYR 3ML LUER SLIP (SYRINGE) IMPLANT
NEEDLE HYPO 22GX1.5 SAFETY (NEEDLE) ×3 IMPLANT
NEEDLE HYPO 25X5/8 SAFETYGLIDE (NEEDLE) IMPLANT
NS IRRIG 1000ML POUR BTL (IV SOLUTION) ×6 IMPLANT
PACK C SECTION WH (CUSTOM PROCEDURE TRAY) ×3 IMPLANT
PAD OB MATERNITY 4.3X12.25 (PERSONAL CARE ITEMS) ×3 IMPLANT
PENCIL SMOKE EVAC W/HOLSTER (ELECTROSURGICAL) ×3 IMPLANT
RTRCTR C-SECT PINK 25CM LRG (MISCELLANEOUS) ×3 IMPLANT
STRIP CLOSURE SKIN 1/2X4 (GAUZE/BANDAGES/DRESSINGS) ×2 IMPLANT
SUT MNCRL AB 3-0 PS2 27 (SUTURE) ×3 IMPLANT
SUT SILK 2 0 FSL 18 (SUTURE) IMPLANT
SUT VIC AB 0 CTX 36 (SUTURE) ×6
SUT VIC AB 0 CTX36XBRD ANBCTRL (SUTURE) ×2 IMPLANT
SUT VIC AB 1 CT1 36 (SUTURE) ×6 IMPLANT
SUT VIC AB 2-0 CT1 27 (SUTURE)
SUT VIC AB 2-0 CT1 TAPERPNT 27 (SUTURE) IMPLANT
SYR 20CC LL (SYRINGE) ×3 IMPLANT
TOWEL OR 17X24 6PK STRL BLUE (TOWEL DISPOSABLE) ×3 IMPLANT
TRAY FOLEY CATH SILVER 14FR (SET/KITS/TRAYS/PACK) ×3 IMPLANT

## 2016-05-22 NOTE — Op Note (Addendum)
Preoperative diagnosis: Intrauterine pregnancy at 39 weeks with previous cesarean delivery  Post operative diagnosis: Same  Anesthesia: Spinal  Anesthesiologist: Dr. Acey Lavarignan  Procedure: Repeat low transverse cesarean section  Surgeon: Dr. Dois DavenportSandra Sundeep Destin  Assistant: RNFA  Estimated blood loss: 600 cc  Procedure:  After being informed of the planned procedure and possible complications including bleeding, infection, injury to other organs, informed consent is obtained. The patient is taken to OR #9 and given spinal anesthesia without complication. She is placed in the dorsal decubitus position with the pelvis tilted to the left. She is then prepped and draped in a sterile fashion. A Foley catheter is inserted in her bladder.  After assessing adequate level of anesthesia, we infiltrate the suprapubic area with 20 cc of Marcaine 0.25 and perform a Pfannenstiel incision which is brought down sharply to the fascia. The fascia is entered in a low transverse fashion. Linea alba is dissected. Peritoneum is entered in a midline fashion. An Alexis retractor is easily positioned.   The myometrium is then entered in a low transverse fashion, 2 cm above the vesico-uterine junction ; first with knife and then extended bluntly. Amniotic fluid is clear. We assist the birth of a female  infant in vertex presentation. Mouth and nose are suctioned. The baby is delivered. The cord is clamped and sectioned. The baby is given to the neonatologist present in the room.  10 cc of blood is drawn from the umbilical vein.The placenta is allowed to deliver spontaneously. It is complete and the cord has 3 vessels. Uterine revision is negative.  We proceed with closure of the myometrium in 2 layers: First with a running locked suture of 0 Vicryl, then with a Lembert suture of 0 Vicryl imbricating the first one. Hemostasis is completed with cauterization on peritoneal edges.  Both paracolic gutters are cleaned. Both  tubes and ovaries are assessed and normal. The pelvis is profusely irrigated with warm saline to confirm a satisfactory hemostasis.  Retractors and sponges are removed. Under fascia hemostasis is completed with cauterization. The fascia is then closed with 2 running sutures of 0 Vicryl meeting midline. The wound is irrigated with warm saline and hemostasis is completed with cauterization. The skin is closed with a subcuticular suture of 3-0 Monocryl and Steri-Strips.  Instrument and sponge count is complete x2. Estimated blood loss is 600 cc.  The procedure is well tolerated by the patient who is taken to recovery room in a well and stable condition.  female baby named Merleen MillinerDaria was born at 13:04 and received an Apgar of 9  at 1 minute and 9 at 5 minutes.    Specimen: Placenta sent to [facility]   Larico Dimock A MD 6/22/201712:23 PM

## 2016-05-22 NOTE — Anesthesia Procedure Notes (Signed)
Spinal Patient location during procedure: OR Staffing Anesthesiologist: Montez Hageman Performed by: anesthesiologist  Preanesthetic Checklist Completed: patient identified, site marked, surgical consent, pre-op evaluation, timeout performed, IV checked, risks and benefits discussed and monitors and equipment checked Spinal Block Patient position: sitting Prep: ChloraPrep Patient monitoring: heart rate, continuous pulse ox and blood pressure Approach: right paramedian Location: L4-5 Injection technique: single-shot Needle Needle type: Sprotte  Needle gauge: 24 G Needle length: 9 cm Additional Notes Expiration date of kit checked and confirmed. Patient tolerated procedure well, without complications.

## 2016-05-22 NOTE — Addendum Note (Signed)
Addendum  created 05/22/16 1928 by Shanon PayorSuzanne M Gabreal Worton, CRNA   Modules edited: Clinical Notes   Clinical Notes:  File: [id number]

## 2016-05-22 NOTE — Anesthesia Preprocedure Evaluation (Addendum)
Anesthesia Evaluation  Patient identified by MRN, date of birth, ID band Patient awake    Reviewed: Allergy & Precautions, NPO status , Patient's Chart, lab work & pertinent test results  Airway Mallampati: II  TM Distance: >3 FB Neck ROM: Full    Dental no notable dental hx.    Pulmonary Current Smoker,    Pulmonary exam normal breath sounds clear to auscultation       Cardiovascular negative cardio ROS Normal cardiovascular exam Rhythm:Regular Rate:Normal     Neuro/Psych negative neurological ROS  negative psych ROS   GI/Hepatic negative GI ROS, Neg liver ROS,   Endo/Other  negative endocrine ROS  Renal/GU negative Renal ROS  negative genitourinary   Musculoskeletal negative musculoskeletal ROS (+)   Abdominal   Peds negative pediatric ROS (+)  Hematology negative hematology ROS (+)   Anesthesia Other Findings   Reproductive/Obstetrics negative OB ROS (+) Pregnancy                            Anesthesia Physical Anesthesia Plan  ASA: II  Anesthesia Plan: Spinal   Post-op Pain Management:    Induction:   Airway Management Planned: Natural Airway  Additional Equipment:   Intra-op Plan:   Post-operative Plan:   Informed Consent: I have reviewed the patients History and Physical, chart, labs and discussed the procedure including the risks, benefits and alternatives for the proposed anesthesia with the patient or authorized representative who has indicated his/her understanding and acceptance.   Dental advisory given  Plan Discussed with: CRNA  Anesthesia Plan Comments:         Anesthesia Quick Evaluation

## 2016-05-22 NOTE — Transfer of Care (Signed)
Immediate Anesthesia Transfer of Care Note  Patient: Caitlin Simmons  Procedure(s) Performed: Procedure(s) with comments: REPEAT CESAREAN SECTION (N/A) -  need RNFA - [name]  Patient Location: PACU  Anesthesia Type:Spinal  Level of Consciousness: awake, alert  and oriented  Airway & Oxygen Therapy: Patient Spontanous Breathing  Post-op Assessment: Report given to RN and Post -op Vital signs reviewed and stable  Post vital signs: Reviewed and stable  Last Vitals:  Filed Vitals:   05/22/16 1118  BP: 94/81  Pulse: 87  Temp: 36.8 C  Resp: 20    Last Pain:  Filed Vitals:   05/22/16 1312  PainSc: 3       Patients Stated Pain Goal: 3 (05/22/16 1118)  Complications: No apparent anesthesia complications

## 2016-05-22 NOTE — Interval H&P Note (Signed)
History and Physical Interval Note:  05/22/2016 12:01 PM  Caitlin Simmons  has presented today for surgery, with the diagnosis of Prior Cesarean Section, Group B Strep Carrier  The various methods of treatment have been discussed with the patient and family. After consideration of risks, benefits and other options for treatment, the patient has consented to  Procedure(s) with comments: REPEAT CESAREAN SECTION (N/A) -  need RNFA - [name] as a surgical intervention .  The patient's history has been reviewed, patient examined, no change in status, stable for surgery.  I have reviewed the patient's chart and labs.  Questions were answered to the patient's satisfaction.     [name] A

## 2016-05-22 NOTE — Anesthesia Postprocedure Evaluation (Signed)
Anesthesia Post Note  Patient: Caitlin MoreAlyssa L Simmons  Procedure(s) Performed: Procedure(s) (LRB): REPEAT CESAREAN SECTION (N/A)  Patient location during evaluation: PACU Anesthesia Type: Spinal Level of consciousness: awake and alert Pain management: pain level controlled Vital Signs Assessment: post-procedure vital signs reviewed and stable Respiratory status: spontaneous breathing and respiratory function stable Cardiovascular status: blood pressure returned to baseline and stable Postop Assessment: no headache, no backache and spinal receding Anesthetic complications: no     Last Vitals:  Filed Vitals:   05/22/16 1400 05/22/16 1415  BP: 112/72 108/62  Pulse: 69 66  Temp:    Resp: 16 18    Last Pain:  Filed Vitals:   05/22/16 1418  PainSc: 0-No pain   Pain Goal: Patients Stated Pain Goal: 3 (05/22/16 1118)               Phillips Groutarignan, [name]

## 2016-05-22 NOTE — Anesthesia Postprocedure Evaluation (Signed)
Anesthesia Post Note  Patient: Caitlin Simmons  Procedure(s) Performed: Procedure(s) (LRB): REPEAT CESAREAN SECTION (N/A)  Patient location during evaluation: Mother Baby Anesthesia Type: Spinal Level of consciousness: awake and alert and oriented Pain management: satisfactory to patient Vital Signs Assessment: post-procedure vital signs reviewed and stable Respiratory status: spontaneous breathing and nonlabored ventilation Cardiovascular status: stable Postop Assessment: no headache, no backache, patient able to bend at knees, no signs of nausea or vomiting and adequate PO intake Anesthetic complications: no     Last Vitals:  Filed Vitals:   05/22/16 1825 05/22/16 1830  BP: 116/58 115/74  Pulse: 63 80  Temp:    Resp: 18 29    Last Pain:  Filed Vitals:   05/22/16 1858  PainSc: 0-No pain   Pain Goal: Patients Stated Pain Goal: 3 (05/22/16 1118)               [name]

## 2016-05-22 NOTE — Progress Notes (Signed)
Assumed care of mom and infant.  Mom in bed, resting, and visiting with grandparents at bedside.  Sibling present.

## 2016-05-22 NOTE — Lactation Note (Signed)
This note was copied from a baby's chart. Lactation Consultation Note  Patient Name: Caitlin Simmons ZOXWR'UToday's Date: 05/22/2016 Reason for consult: Initial assessment Baby at 5 hr of life. Experienced bf mom reports latching is going well. She denies breast or nipple pain, voiced no concerns. She bf her older child until he was 331m old and she was told by his MD to switch him to formula because he was not gaining wt well. Discussed baby behavior, feeding frequency, baby belly size, voids, wt loss, breast changes, and nipple care. She stated she can manually express and has a spoon in the room. Given lactation handouts. Aware of OP services and support group.  Maternal Data Has patient been taught Hand Expression?: Yes Does the patient have breastfeeding experience prior to this delivery?: Yes  Feeding Feeding Type: Breast Fed  LATCH Score/Interventions Latch: Repeated attempts needed to sustain latch, nipple held in mouth throughout feeding, stimulation needed to elicit sucking reflex. Intervention(s): Adjust position;Assist with latch  Audible Swallowing: A few with stimulation Intervention(s): Skin to skin;Hand expression  Type of Nipple: Everted at rest and after stimulation  Comfort (Breast/Nipple): Soft / non-tender     Hold (Positioning): Assistance needed to correctly position infant at breast and maintain latch. Intervention(s): Support Pillows;Position options  LATCH Score: 7  Lactation Tools Discussed/Used WIC Program: Yes   Consult Status Consult Status: Follow-up Date: 05/23/16 Follow-up type: In-patient    Rulon Eisenmengerlizabeth E Iridessa Harrow 05/22/2016, 7:01 PM

## 2016-05-23 ENCOUNTER — Encounter (HOSPITAL_COMMUNITY): Payer: Self-pay | Admitting: Obstetrics and Gynecology

## 2016-05-23 LAB — CBC
HCT: 31.7 % — ABNORMAL LOW (ref 36.0–46.0)
HEMOGLOBIN: 11.3 g/dL — AB (ref 12.0–15.0)
MCH: 31.7 pg (ref 26.0–34.0)
MCHC: 35.6 g/dL (ref 30.0–36.0)
MCV: 89 fL (ref 78.0–100.0)
PLATELETS: 128 10*3/uL — AB (ref 150–400)
RBC: 3.56 MIL/uL — AB (ref 3.87–5.11)
RDW: 13.6 % (ref 11.5–15.5)
WBC: 14.3 10*3/uL — AB (ref 4.0–10.5)

## 2016-05-23 LAB — BIRTH TISSUE RECOVERY COLLECTION (PLACENTA DONATION)

## 2016-05-23 NOTE — Lactation Note (Signed)
This note was copied from a baby's chart. Lactation Consultation Note  Patient Name: Caitlin Simmons ZOXWR'UToday's Date: 05/23/2016 Reason for consult: Follow-up assessment Baby at 25 hr of life. Mom was sleeping. MGM updated feedings and voids. FOB stated bf is going well. Left instructions for mom to call at next feeding.   Maternal Data    Feeding Feeding Type: Breast Fed Length of feed: 30 min  LATCH Score/Interventions Latch: Grasps breast easily, tongue down, lips flanged, rhythmical sucking.  Audible Swallowing: A few with stimulation Intervention(s): Skin to skin;Hand expression  Type of Nipple: Everted at rest and after stimulation  Comfort (Breast/Nipple): Soft / non-tender     Hold (Positioning): Assistance needed to correctly position infant at breast and maintain latch. Intervention(s): Breastfeeding basics reviewed;Support Pillows;Position options;Skin to skin  LATCH Score: 8  Lactation Tools Discussed/Used     Consult Status Consult Status: Follow-up Date: 05/23/16 Follow-up type: In-patient    Rulon Eisenmengerlizabeth E Floride Hutmacher 05/23/2016, 2:26 PM

## 2016-05-23 NOTE — Progress Notes (Signed)
Subjective:  Doing okay. Not much sleep. Adequate pain management.  Objective:  BP 94/57 mmHg  Pulse 84  Temp(Src) 98.5 F (36.9 C) (Oral)  Resp 20  SpO2 97%  LMP 08/23/2015  Breastfeeding? Unknown  CBC    Component Value Date/Time   WBC 14.3* 05/23/2016 0532   RBC 3.56* 05/23/2016 0532   HGB 11.3* 05/23/2016 0532   HCT 31.7* 05/23/2016 0532   PLT 128* 05/23/2016 0532   MCV 89.0 05/23/2016 0532   MCH 31.7 05/23/2016 0532   MCHC 35.6 05/23/2016 0532   RDW 13.6 05/23/2016 0532    Chest: Clear. Heart: Regular rate and rhythm. Abdomen: Soft and nontender.  Assessment:  Postoperative day #1 status post cesarean section-repeat  Anemia postoperatively  Plan:  Routine postoperative cesarean section care.  The patient wants to go home tomorrow.  Dr. Stefano GaulStringer 05/23/2016 9:40 AM

## 2016-05-24 MED ORDER — IBUPROFEN 600 MG PO TABS: 600.0000 mg | ORAL_TABLET | Freq: Four times a day (QID) | ORAL | Status: AC | PRN

## 2016-05-24 MED ORDER — OXYCODONE-ACETAMINOPHEN 5-325 MG PO TABS: 1.0000 | ORAL_TABLET | ORAL | Status: AC | PRN

## 2016-05-24 NOTE — Discharge Summary (Signed)
OB Discharge Summary     Patient Name: Caitlin Simmons DOB: February 09, 1981 MRN: [id number]  Date of admission: 05/22/2016 Delivering MD: Delsa Bern   Date of discharge: 05/24/2016  Admitting diagnosis: Prior Cesarean Section, Group B Strep Carrier Intrauterine pregnancy: [redacted]w[redacted]d    Secondary diagnosis:  Principal Problem:   Cesarean delivery delivered Active Problems:   History of cesarean section, low transverse   GBS carrier   Light cigarette smoker   Obesity (BMI 30.0-34.9)  Additional problems: None     Discharge diagnosis: Term Pregnancy Delivered                                                                                                Post partum procedures:NA  Augmentation: None  Complications: None  Hospital course:  35y.o. yo G2P2002 at 365w0das admitted to the hospital 05/22/2016 for elective repeat c-section, and delivered a Viable infant,@BABYSUPPRESS (DBLINK,ept,110,,1,,) Membrane Rupture Time/Date: )1:04 PM ,05/22/2016   @Details  of operation can be found in separate operative Note.  Patient had an uncomplicated postpartum course. She is ambulating, tolerating a regular diet, passing flatus, and urinating well. Patient is discharged home in stable condition on 05/24/2016.                                     Physical exam  Filed Vitals:   05/23/16 0900 05/23/16 1225 05/23/16 1813 05/24/16 0500  BP: 105/55 110/55 100/56 102/67  Pulse: 73 71 79 57  Temp: 98.3 F (36.8 C) 98.5 F (36.9 C) 98 F (36.7 C) 98.1 F (36.7 C)  TempSrc: Oral Oral Oral Oral  Resp: 20 18 16 18   SpO2: 98% 98% 98%    General: alert Lochia: appropriate Uterine Fundus: firm Incision: Healing well with no significant drainage, No significant erythema DVT Evaluation: No evidence of DVT seen on physical exam. Negative Homan's sign. No significant calf/ankle edema. Labs: Lab Results  Component Value Date   WBC 14.3* 05/23/2016   HGB 11.3* 05/23/2016   HCT 31.7* 05/23/2016   MCV 89.0 05/23/2016   PLT 128* 05/23/2016     Discharge instruction: per After Visit Summary and "Baby and Me Booklet".  Current facility-administered medications:  .  acetaminophen (TYLENOL) tablet 650 mg, 650 mg, Oral, Q4H PRN, SaDelsa BernMD .  coconut oil, 1 application, Topical, PRN, SaDelsa BernMD .  witch hazel-glycerin (TUCKS) pad 1 application, 1 application, Topical, PRN **AND** dibucaine (NUPERCAINAL) 1 % rectal ointment 1 application, 1 application, Rectal, PRN, SaDelsa BernMD .  diphenhydrAMINE (BENADRYL) injection 12.5 mg, 12.5 mg, Intravenous, Q4H PRN **OR** diphenhydrAMINE (BENADRYL) capsule 25 mg, 25 mg, Oral, Q4H PRN, PeMontez HagemanMD .  diphenhydrAMINE (BENADRYL) capsule 25 mg, 25 mg, Oral, Q6H PRN, SaDelsa BernMD .  ferrous sulfate tablet 325 mg, 325 mg, Oral, BID WC, SaDelsa BernMD, 325 mg at 05/23/16 1822 .  ibuprofen (ADVIL,MOTRIN) tablet 600 mg, 600 mg, Oral, Q6H, SaDelsa BernMD, 600 mg at 05/24/16 1202 .  lactated ringers infusion, , Intravenous,  Continuous, Delsa Bern, MD .  measles, mumps and rubella vaccine (MMR) injection 0.5 mL, 0.5 mL, Subcutaneous, Once, Delsa Bern, MD, 0.5 mL at 05/23/16 1012 .  menthol-cetylpyridinium (CEPACOL) lozenge 3 mg, 1 lozenge, Oral, Q2H PRN, Delsa Bern, MD .  methylergonovine (METHERGINE) tablet 0.2 mg, 0.2 mg, Oral, Q4H PRN **OR** methylergonovine (METHERGINE) injection 0.2 mg, 0.2 mg, Intramuscular, Q4H PRN, Delsa Bern, MD .  nalbuphine (NUBAIN) injection 5 mg, 5 mg, Intravenous, Q4H PRN **OR** nalbuphine (NUBAIN) injection 5 mg, 5 mg, Subcutaneous, Q4H PRN, Montez Hageman, MD .  nalbuphine (NUBAIN) injection 5 mg, 5 mg, Intravenous, Once PRN **OR** nalbuphine (NUBAIN) injection 5 mg, 5 mg, Subcutaneous, Once PRN, Montez Hageman, MD .  naloxone [facility]) 2 mg in dextrose 5 % 250 mL infusion, 1-4 mcg/kg/hr, Intravenous, Continuous PRN, Montez Hageman, MD .  naloxone [facility]) injection 0.4 mg, 0.4 mg,  Intravenous, PRN **AND** sodium chloride flush (NS) 0.9 % injection 3 mL, 3 mL, Intravenous, PRN, Montez Hageman, MD .  ondansetron [facility]) injection 4 mg, 4 mg, Intravenous, Q8H PRN, Montez Hageman, MD .  oxyCODONE-acetaminophen (PERCOCET/ROXICET) 5-325 MG per tablet 1 tablet, 1 tablet, Oral, Q4H PRN, Delsa Bern, MD, 1 tablet at 05/24/16 0020 .  oxyCODONE-acetaminophen (PERCOCET/ROXICET) 5-325 MG per tablet 2 tablet, 2 tablet, Oral, Q4H PRN, Delsa Bern, MD .  prenatal multivitamin tablet 1 tablet, 1 tablet, Oral, Q1200, Delsa Bern, MD, 1 tablet at 05/23/16 1155 .  scopolamine (TRANSDERM-SCOP) 1 MG/3DAYS 1.5 mg, 1 patch, Transdermal, Once, Montez Hageman, MD .  senna-docusate (Senokot-S) tablet 2 tablet, 2 tablet, Oral, Q24H, Delsa Bern, MD, 2 tablet at 05/24/16 0021 .  simethicone (MYLICON) chewable tablet 80 mg, 80 mg, Oral, TID PC, Delsa Bern, MD, 80 mg at 05/23/16 1823 .  simethicone (MYLICON) chewable tablet 80 mg, 80 mg, Oral, Q24H, Delsa Bern, MD, 80 mg at 05/24/16 0021 .  simethicone (MYLICON) chewable tablet 80 mg, 80 mg, Oral, PRN, Delsa Bern, MD .  Tdap (BOOSTRIX) injection 0.5 mL, 0.5 mL, Intramuscular, Once, Delsa Bern, MD, 0.5 mL at 05/23/16 1012 .  zolpidem (AMBIEN) tablet 5 mg, 5 mg, Oral, QHS PRN, Delsa Bern, MD  Current outpatient prescriptions:  .  Prenatal Vit-Fe Fumarate-FA (PRENATAL MULTIVITAMIN) TABS, Take 1 tablet by mouth daily., Disp: , Rfl:  .  ibuprofen (ADVIL,MOTRIN) 600 MG tablet, Take 1 tablet (600 mg total) by mouth every 6 (six) hours as needed for fever, headache, mild pain, moderate pain or cramping., Disp: 30 tablet, Rfl: 2 .  oxyCODONE-acetaminophen (PERCOCET/ROXICET) 5-325 MG tablet, Take 1 tablet by mouth every 4 (four) hours as needed (pain scale 4-7)., Disp: 30 tablet, Rfl: 0 After visit meds:    Medication List    STOP taking these medications        acetaminophen 325 MG tablet  Commonly known as:  TYLENOL      TAKE  these medications        ibuprofen 600 MG tablet  Commonly known as:  ADVIL,MOTRIN  Take 1 tablet (600 mg total) by mouth every 6 (six) hours as needed for fever, headache, mild pain, moderate pain or cramping.     oxyCODONE-acetaminophen 5-325 MG tablet  Commonly known as:  PERCOCET/ROXICET  Take 1 tablet by mouth every 4 (four) hours as needed (pain scale 4-7).     prenatal multivitamin Tabs tablet  Take 1 tablet by mouth daily.        Diet: routine diet  Activity: Advance as tolerated. Pelvic rest for 6 weeks.  Outpatient follow up:6 weeks  Postpartum contraception: Undecided  Newborn Data: Live born female "Daria" Birth Weight: 8 lb 4.5 oz (3755 g) APGARS: 9, 9  Baby Feeding: Breast Disposition:home with mother   05/24/2016 Farrel Gordon, CNM

## 2016-05-24 NOTE — Discharge Instructions (Signed)
Contraception Choices Contraception (birth control) is the use of any methods or devices to prevent pregnancy. Below are some methods to help avoid pregnancy. HORMONAL METHODS   Contraceptive implant. This is a thin, plastic tube containing progesterone hormone. It does not contain estrogen hormone. Your health care provider inserts the tube in the inner part of the upper arm. The tube can remain in place for up to 3 years. After 3 years, the implant must be removed. The implant prevents the ovaries from releasing an egg (ovulation), thickens the cervical mucus to prevent sperm from entering the uterus, and thins the lining of the inside of the uterus.  Progesterone-only injections. These injections are given every 3 months by your health care provider to prevent pregnancy. This synthetic progesterone hormone stops the ovaries from releasing eggs. It also thickens cervical mucus and changes the uterine lining. This makes it harder for sperm to survive in the uterus.  Birth control pills. These pills contain estrogen and progesterone hormone. They work by preventing the ovaries from releasing eggs (ovulation). They also cause the cervical mucus to thicken, preventing the sperm from entering the uterus. Birth control pills are prescribed by a health care provider.Birth control pills can also be used to treat heavy periods.  Minipill. This type of birth control pill contains only the progesterone hormone. They are taken every day of each month and must be prescribed by your health care provider.  Birth control patch. The patch contains hormones similar to those in birth control pills. It must be changed once a week and is prescribed by a health care provider.  Vaginal ring. The ring contains hormones similar to those in birth control pills. It is left in the vagina for 3 weeks, removed for 1 week, and then a new one is put back in place. The patient must be comfortable inserting and removing the ring  from the vagina.A health care provider's prescription is necessary.  Emergency contraception. Emergency contraceptives prevent pregnancy after unprotected sexual intercourse. This pill can be taken right after sex or up to 5 days after unprotected sex. It is most effective the sooner you take the pills after having sexual intercourse. Most emergency contraceptive pills are available without a prescription. Check with your pharmacist. Do not use emergency contraception as your only form of birth control. BARRIER METHODS   Female condom. This is a thin sheath (latex or rubber) that is worn over the penis during sexual intercourse. It can be used with spermicide to increase effectiveness.  Female condom. This is a soft, loose-fitting sheath that is put into the vagina before sexual intercourse.  Diaphragm. This is a soft, latex, dome-shaped barrier that must be fitted by a health care provider. It is inserted into the vagina, along with a spermicidal jelly. It is inserted before intercourse. The diaphragm should be left in the vagina for 6 to 8 hours after intercourse.  Cervical cap. This is a round, soft, latex or plastic cup that fits over the cervix and must be fitted by a health care provider. The cap can be left in place for up to 48 hours after intercourse.  Sponge. This is a soft, circular piece of polyurethane foam. The sponge has spermicide in it. It is inserted into the vagina after wetting it and before sexual intercourse.  Spermicides. These are chemicals that kill or block sperm from entering the cervix and uterus. They come in the form of creams, jellies, suppositories, foam, or tablets. They do not require a  prescription. They are inserted into the vagina with an applicator before having sexual intercourse. The process must be repeated every time you have sexual intercourse. INTRAUTERINE CONTRACEPTION  Intrauterine device (IUD). This is a T-shaped device that is put in a woman's uterus  during a menstrual period to prevent pregnancy. There are 2 types:  Copper IUD. This type of IUD is wrapped in copper wire and is placed inside the uterus. Copper makes the uterus and fallopian tubes produce a fluid that kills sperm. It can stay in place for 10 years.  Hormone IUD. This type of IUD contains the hormone progestin (synthetic progesterone). The hormone thickens the cervical mucus and prevents sperm from entering the uterus, and it also thins the uterine lining to prevent implantation of a fertilized egg. The hormone can weaken or kill the sperm that get into the uterus. It can stay in place for 3-5 years, depending on which type of IUD is used. PERMANENT METHODS OF CONTRACEPTION  Female tubal ligation. This is when the woman's fallopian tubes are surgically sealed, tied, or blocked to prevent the egg from traveling to the uterus.  Hysteroscopic sterilization. This involves placing a small coil or insert into each fallopian tube. Your doctor uses a technique called hysteroscopy to do the procedure. The device causes scar tissue to form. This results in permanent blockage of the fallopian tubes, so the sperm cannot fertilize the egg. It takes about 3 months after the procedure for the tubes to become blocked. You must use another form of birth control for these 3 months.  Female sterilization. This is when the female has the tubes that carry sperm tied off (vasectomy).This blocks sperm from entering the vagina during sexual intercourse. After the procedure, the man can still ejaculate fluid (semen). NATURAL PLANNING METHODS  Natural family planning. This is not having sexual intercourse or using a barrier method (condom, diaphragm, cervical cap) on days the woman could become pregnant.  Calendar method. This is keeping track of the length of each menstrual cycle and identifying when you are fertile.  Ovulation method. This is avoiding sexual intercourse during ovulation.  Symptothermal  method. This is avoiding sexual intercourse during ovulation, using a thermometer and ovulation symptoms.  Post-ovulation method. This is timing sexual intercourse after you have ovulated. Regardless of which type or method of contraception you choose, it is important that you use condoms to protect against the transmission of sexually transmitted infections (STIs). Talk with your health care provider about which form of contraception is most appropriate for you.   This information is not intended to replace advice given to you by your health care provider. Make sure you discuss any questions you have with your health care provider.   Document Released: 11/17/2005 Document Revised: 11/22/2013 Document Reviewed: 05/12/2013 Elsevier Interactive Patient Education 2016 [employer]. Iron-Rich Diet Iron is a mineral that helps your body to produce hemoglobin. Hemoglobin is a protein in your red blood cells that carries oxygen to your body's tissues. Eating too little iron may cause you to feel weak and tired, and it can increase your risk for infection. Eating enough iron is necessary for your body's metabolism, muscle function, and nervous system. Iron is naturally found in many foods. It can also be added to foods or fortified in foods. There are two types of dietary iron:  Heme iron. Heme iron is absorbed by the body more easily than nonheme iron. Heme iron is found in meat, poultry, and fish.  Nonheme iron. Nonheme  iron is found in dietary supplements, iron-fortified grains, beans, and vegetables. You may need to follow an iron-rich diet if:  You have been diagnosed with iron deficiency or iron-deficiency anemia.  You have a condition that prevents you from absorbing dietary iron, such as:  Infection in your intestines.  Celiac disease. This involves long-lasting (chronic) inflammation of your intestines.  You do not eat enough iron.  You eat a diet that is high in foods that impair iron  absorption.  You have lost a lot of blood.  You have heavy bleeding during your menstrual cycle.  You are pregnant. WHAT IS MY PLAN? Your health care provider may help you to determine how much iron you need per day based on your condition. Generally, when a person consumes sufficient amounts of iron in the diet, the following iron needs are met:  Men.  38-59 years old: 11 mg per day.  46-33 years old: 8 mg per day.  Women.   65-48 years old: 15 mg per day.  94-25 years old: 18 mg per day.  Over 72 years old: 8 mg per day.  Pregnant women: 27 mg per day.  Breastfeeding women: 9 mg per day. WHAT DO I NEED TO KNOW ABOUT AN IRON-RICH DIET?  Eat fresh fruits and vegetables that are high in vitamin C along with foods that are high in iron. This will help increase the amount of iron that your body absorbs from food, especially with foods containing nonheme iron. Foods that are high in vitamin C include oranges, peppers, tomatoes, and mango.  Take iron supplements only as directed by your health care provider. Overdose of iron can be life-threatening. If you were prescribed iron supplements, take them with orange juice or a vitamin C supplement.  Cook foods in pots and pans that are made from iron.   Eat nonheme iron-containing foods alongside foods that are high in heme iron. This helps to improve your iron absorption.   Certain foods and drinks contain compounds that impair iron absorption. Avoid eating these foods in the same meal as iron-rich foods or with iron supplements. These include:  Coffee, black tea, and red wine.  Milk, dairy products, and foods that are high in calcium.  Beans, soybeans, and peas.  Whole grains.  When eating foods that contain both nonheme iron and compounds that impair iron absorption, follow these tips to absorb iron better.   Soak beans overnight before cooking.  Soak whole grains overnight and drain them before using.  Ferment  flours before baking, such as using yeast in bread dough. WHAT FOODS CAN I EAT? Grains Iron-fortified breakfast cereal. Iron-fortified whole-wheat bread. Enriched rice. Sprouted grains. Vegetables Spinach. Potatoes with skin. Green peas. Broccoli. Red and green bell peppers. Fermented vegetables. Fruits Prunes. Raisins. Oranges. Strawberries. Mango. Grapefruit. Meats and Other Protein Sources Beef liver. Oysters. Beef. Shrimp. [place]. Chicken. [place]. Sardines. Chickpeas. Nuts. Tofu. Beverages Tomato juice. Fresh orange juice. Prune juice. Hibiscus tea. Fortified instant breakfast shakes. Condiments Tahini. Fermented soy sauce. Sweets and Desserts Black-strap molasses.  Other Wheat germ. The items listed above may not be a complete list of recommended foods or beverages. Contact your dietitian for more options. WHAT FOODS ARE NOT RECOMMENDED? Grains Whole grains. Bran cereal. Bran flour. Oats. Vegetables Artichokes. Brussels sprouts. Kale. Fruits Blueberries. Raspberries. Strawberries. Figs. Meats and Other Protein Sources Soybeans. Products made from soy protein. Dairy Milk. Cream. Cheese. Yogurt. Cottage cheese. Beverages Coffee. Black tea. Red wine. Sweets and Desserts Cocoa. Chocolate. Ice  cream. Other Basil. Oregano. Parsley. The items listed above may not be a complete list of foods and beverages to avoid. Contact your dietitian for more information.   This information is not intended to replace advice given to you by your health care provider. Make sure you discuss any questions you have with your health care provider.   Document Released: 07/01/2005 Document Revised: 12/08/2014 Document Reviewed: 06/14/2014 Elsevier Interactive Patient Education 2016 Elsevier Inc[employer] Breastfeeding and Mastitis Mastitis is inflammation of the breast tissue. It can occur in women who are breastfeeding. This can make breastfeeding painful. Mastitis will sometimes go away  on its own. Your health care provider will help determine if treatment is needed. CAUSES Mastitis is often associated with a blocked milk (lactiferous) duct. This can happen when too much milk builds up in the breast. Causes of excess milk in the breast can include:  Poor latch-on. If your baby is not latched onto the breast properly, she or he may not empty your breast completely while breastfeeding.  Allowing too much time to pass between feedings.  Wearing a bra or other clothing that is too tight. This puts extra pressure on the lactiferous ducts so milk does not flow through them as it should. Mastitis can also be caused by a bacterial infection. Bacteria may enter the breast tissue through cuts or openings in the skin. In women who are breastfeeding, this may occur because of cracked or irritated skin. Cracks in the skin are often caused when your baby does not latch on properly to the breast. SIGNS AND SYMPTOMS  Swelling, redness, tenderness, and pain in an area of the breast.  Swelling of the glands under the arm on the same side.  Fever may or may not accompany mastitis. If an infection is allowed to progress, a collection of pus (abscess) may develop. DIAGNOSIS  Your health care provider can usually diagnose mastitis based on your symptoms and a physical exam. Tests may be done to help confirm the diagnosis. These may include:  Removal of pus from the breast by applying pressure to the area. This pus can be examined in the lab to determine which bacteria are present. If an abscess has developed, the fluid in the abscess can be removed with a needle. This can also be used to confirm the diagnosis and determine the bacteria present. In most cases, pus will not be present.  Blood tests to determine if your body is fighting a bacterial infection.  Mammogram or ultrasound tests to rule out other problems or diseases. TREATMENT  Mastitis that occurs with breastfeeding will sometimes go  away on its own. Your health care provider may choose to wait 24 hours after first seeing you to decide whether a prescription medicine is needed. If your symptoms are worse after 24 hours, your health care provider will likely prescribe an antibiotic medicine to treat the mastitis. He or she will determine which bacteria are most likely causing the infection and will then select an appropriate antibiotic medicine. This is sometimes changed based on the results of tests performed to identify the bacteria, or if there is no response to the antibiotic medicine selected. Antibiotic medicines are usually given by mouth. You may also be given medicine for pain. HOME CARE INSTRUCTIONS  Only take over-the-counter or prescription medicines for pain, fever, or discomfort as directed by your health care provider.  If your health care provider prescribed an antibiotic medicine, take the medicine as directed. Make sure you finish it even  if you start to feel better.  Do not wear a tight or underwire bra. Wear a soft, supportive bra.  Increase your fluid intake, especially if you have a fever.  Continue to empty the breast. Your health care provider can tell you whether this milk is safe for your infant or needs to be thrown out. You may be told to stop nursing until your health care provider thinks it is safe for your baby. Use a breast pump if you are advised to stop nursing.  Keep your nipples clean and dry.  Empty the first breast completely before going to the other breast. If your baby is not emptying your breasts completely for some reason, use a breast pump to empty your breasts.  If you go back to work, pump your breasts while at work to stay in time with your nursing schedule.  Avoid allowing your breasts to become overly filled with milk (engorged). SEEK MEDICAL CARE IF:  You have pus-like discharge from the breast.  Your symptoms do not improve with the treatment prescribed by your health care  provider within 2 days. SEEK IMMEDIATE MEDICAL CARE IF:  Your pain and swelling are getting worse.  You have pain that is not controlled with medicine.  You have a red line extending from the breast toward your armpit.  You have a fever or persistent symptoms for more than 2-3 days.  You have a fever and your symptoms suddenly get worse. MAKE SURE YOU:   Understand these instructions.  Will watch your condition.  Will get help right away if you are not doing well or get worse.   This information is not intended to replace advice given to you by your health care provider. Make sure you discuss any questions you have with your health care provider.   Document Released: 03/14/2005 Document Revised: 11/22/2013 Document Reviewed: 06/23/2013 Elsevier Interactive Patient Education [employer]. Breastfeeding Deciding to breastfeed is one of the best choices you can make for you and your baby. A change in hormones during pregnancy causes your breast tissue to grow and increases the number and size of your milk ducts. These hormones also allow proteins, sugars, and fats from your blood supply to make breast milk in your milk-producing glands. Hormones prevent breast milk from being released before your baby is born as well as prompt milk flow after birth. Once breastfeeding has begun, thoughts of your baby, as well as his or her sucking or crying, can stimulate the release of milk from your milk-producing glands.  BENEFITS OF BREASTFEEDING For Your Baby  Your first milk (colostrum) helps your baby's digestive system function better.  There are antibodies in your milk that help your baby fight off infections.  Your baby has a lower incidence of asthma, allergies, and sudden infant death syndrome.  The nutrients in breast milk are better for your baby than infant formulas and are designed uniquely for your baby's needs.  Breast milk improves your baby's brain development.  Your baby  is less likely to develop other conditions, such as childhood obesity, asthma, or type 2 diabetes mellitus. For You  Breastfeeding helps to create a very special bond between you and your baby.  Breastfeeding is convenient. Breast milk is always available at the correct temperature and costs nothing.  Breastfeeding helps to burn calories and helps you lose the weight gained during pregnancy.  Breastfeeding makes your uterus contract to its prepregnancy size faster and slows bleeding (lochia) after you give birth.  Breastfeeding helps to lower your risk of developing type 2 diabetes mellitus, osteoporosis, and breast or ovarian cancer later in life. SIGNS THAT YOUR BABY IS HUNGRY Early Signs of Hunger  Increased alertness or activity.  Stretching.  Movement of the head from side to side.  Movement of the head and opening of the mouth when the corner of the mouth or cheek is stroked (rooting).  Increased sucking sounds, smacking lips, cooing, sighing, or squeaking.  Hand-to-mouth movements.  Increased sucking of fingers or hands. Late Signs of Hunger  Fussing.  Intermittent crying. Extreme Signs of Hunger Signs of extreme hunger will require calming and consoling before your baby will be able to breastfeed successfully. Do not wait for the following signs of extreme hunger to occur before you initiate breastfeeding:  Restlessness.  A loud, strong cry.  Screaming. BREASTFEEDING BASICS Breastfeeding Initiation  Find a comfortable place to sit or lie down, with your neck and back well supported.  Place a pillow or rolled up blanket under your baby to bring him or her to the level of your breast (if you are seated). Nursing pillows are specially designed to help support your arms and your baby while you breastfeed.  Make sure that your baby's abdomen is facing your abdomen.  Gently massage your breast. With your fingertips, massage from your chest wall toward your nipple  in a circular motion. This encourages milk flow. You may need to continue this action during the feeding if your milk flows slowly.  Support your breast with 4 fingers underneath and your thumb above your nipple. Make sure your fingers are well away from your nipple and your baby's mouth.  Stroke your baby's lips gently with your finger or nipple.  When your baby's mouth is open wide enough, quickly bring your baby to your breast, placing your entire nipple and as much of the colored area around your nipple (areola) as possible into your baby's mouth.  More areola should be visible above your baby's upper lip than below the lower lip.  Your baby's tongue should be between his or her lower gum and your breast.  Ensure that your baby's mouth is correctly positioned around your nipple (latched). Your baby's lips should create a seal on your breast and be turned out (everted).  It is common for your baby to suck about 2-3 minutes in order to start the flow of breast milk. Latching Teaching your baby how to latch on to your breast properly is very important. An improper latch can cause nipple pain and decreased milk supply for you and poor weight gain in your baby. Also, if your baby is not latched onto your nipple properly, he or she may swallow some air during feeding. This can make your baby fussy. Burping your baby when you switch breasts during the feeding can help to get rid of the air. However, teaching your baby to latch on properly is still the best way to prevent fussiness from swallowing air while breastfeeding. Signs that your baby has successfully latched on to your nipple:  Silent tugging or silent sucking, without causing you pain.  Swallowing heard between every 3-4 sucks.  Muscle movement above and in front of his or her ears while sucking. Signs that your baby has not successfully latched on to nipple:  Sucking sounds or smacking sounds from your baby while  breastfeeding.  Nipple pain. If you think your baby has not latched on correctly, slip your finger into the corner of your  baby's mouth to break the suction and place it between your baby's gums. Attempt breastfeeding initiation again. Signs of Successful Breastfeeding Signs from your baby:  A gradual decrease in the number of sucks or complete cessation of sucking.  Falling asleep.  Relaxation of his or her body.  Retention of a small amount of milk in his or her mouth.  Letting go of your breast by himself or herself. Signs from you:  Breasts that have increased in firmness, weight, and size 1-3 hours after feeding.  Breasts that are softer immediately after breastfeeding.  Increased milk volume, as well as a change in milk consistency and color by the fifth day of breastfeeding.  Nipples that are not sore, cracked, or bleeding. Signs That Your [employer] is Getting Enough Milk  Wetting at least 3 diapers in a 24-hour period. The urine should be clear and pale yellow by age 27 days.  At least 3 stools in a 24-hour period by age 27 days. The stool should be soft and yellow.  At least 3 stools in a 24-hour period by age 59 days. The stool should be seedy and yellow.  No loss of weight greater than 10% of birth weight during the first 69 days of age.  Average weight gain of 4-7 ounces (113-198 g) per week after age 28 days.  Consistent daily weight gain by age 25 days, without weight loss after the age of 2 weeks. After a feeding, your baby may spit up a small amount. This is common. BREASTFEEDING FREQUENCY AND DURATION Frequent feeding will help you make more milk and can prevent sore nipples and breast engorgement. Breastfeed when you feel the need to reduce the fullness of your breasts or when your baby shows signs of hunger. This is called "breastfeeding on demand." Avoid introducing a pacifier to your baby while you are working to establish breastfeeding (the first 4-6 weeks after  your baby is born). After this time you may choose to use a pacifier. Research has shown that pacifier use during the first year of a baby's life decreases the risk of sudden infant death syndrome (SIDS). Allow your baby to feed on each breast as long as he or she wants. Breastfeed until your baby is finished feeding. When your baby unlatches or falls asleep while feeding from the first breast, offer the second breast. Because newborns are often sleepy in the first few weeks of life, you may need to awaken your baby to get him or her to feed. Breastfeeding times will vary from baby to baby. However, the following rules can serve as a guide to help you ensure that your baby is properly fed:  Newborns (babies 59 weeks of age or younger) may breastfeed every 1-3 hours.  Newborns should not go longer than 3 hours during the day or 5 hours during the night without breastfeeding.  You should breastfeed your baby a minimum of 8 times in a 24-hour period until you begin to introduce solid foods to your baby at around 4 months of age. BREAST MILK PUMPING Pumping and storing breast milk allows you to ensure that your baby is exclusively fed your breast milk, even at times when you are unable to breastfeed. This is especially important if you are going back to work while you are still breastfeeding or when you are not able to be present during feedings. Your lactation consultant can give you guidelines on how long it is safe to store breast milk. A breast pump is  a machine that allows you to pump milk from your breast into a sterile bottle. The pumped breast milk can then be stored in a refrigerator or freezer. Some breast pumps are operated by hand, while others use electricity. Ask your lactation consultant which type will work best for you. Breast pumps can be purchased, but some hospitals and breastfeeding support groups lease breast pumps on a monthly basis. A lactation consultant can teach you how to hand  express breast milk, if you prefer not to use a pump. CARING FOR YOUR BREASTS WHILE YOU BREASTFEED Nipples can become dry, cracked, and sore while breastfeeding. The following recommendations can help keep your breasts moisturized and healthy:  Avoid using soap on your nipples.  Wear a supportive bra. Although not required, special nursing bras and tank tops are designed to allow access to your breasts for breastfeeding without taking off your entire bra or top. Avoid wearing underwire-style bras or extremely tight bras.  Air dry your nipples for 3-71mnutes after each feeding.  Use only cotton bra pads to absorb leaked breast milk. Leaking of breast milk between feedings is normal.  Use lanolin on your nipples after breastfeeding. Lanolin helps to maintain your skin's normal moisture barrier. If you use pure lanolin, you do not need to wash it off before feeding your baby again. Pure lanolin is not toxic to your baby. You may also hand express a few drops of breast milk and gently massage that milk into your nipples and allow the milk to air dry. In the first few weeks after giving birth, some women experience extremely full breasts (engorgement). Engorgement can make your breasts feel heavy, warm, and tender to the touch. Engorgement peaks within 3-5 days after you give birth. The following recommendations can help ease engorgement:  Completely empty your breasts while breastfeeding or pumping. You may want to start by applying warm, moist heat (in the shower or with warm water-soaked hand towels) just before feeding or pumping. This increases circulation and helps the milk flow. If your baby does not completely empty your breasts while breastfeeding, pump any extra milk after he or she is finished.  Wear a snug bra (nursing or regular) or tank top for 1-2 days to signal your body to slightly decrease milk production.  Apply ice packs to your breasts, unless this is too uncomfortable for  you.  Make sure that your baby is latched on and positioned properly while breastfeeding. If engorgement persists after 48 hours of following these recommendations, contact your health care provider or a lScience writer OVERALL HEALTH CARE RECOMMENDATIONS WHILE BREASTFEEDING  Eat healthy foods. Alternate between meals and snacks, eating 3 of each per day. Because what you eat affects your breast milk, some of the foods may make your baby more irritable than usual. Avoid eating these foods if you are sure that they are negatively affecting your baby.  Drink milk, fruit juice, and water to satisfy your thirst (about 10 glasses a day).  Rest often, relax, and continue to take your prenatal vitamins to prevent fatigue, stress, and anemia.  Continue breast self-awareness checks.  Avoid chewing and smoking tobacco. Chemicals from cigarettes that pass into breast milk and exposure to secondhand smoke may harm your baby.  Avoid alcohol and drug use, including marijuana. Some medicines that may be harmful to your baby can pass through breast milk. It is important to ask your health care provider before taking any medicine, including all over-the-counter and prescription medicine as well as  vitamin and herbal supplements. It is possible to become pregnant while breastfeeding. If birth control is desired, ask your health care provider about options that will be safe for your baby. SEEK MEDICAL CARE IF:  You feel like you want to stop breastfeeding or have become frustrated with breastfeeding.  You have painful breasts or nipples.  Your nipples are cracked or bleeding.  Your breasts are red, tender, or warm.  You have a swollen area on either breast.  You have a fever or chills.  You have nausea or vomiting.  You have drainage other than breast milk from your nipples.  Your breasts do not become full before feedings by the fifth day after you give birth.  You feel sad and  depressed.  Your baby is too sleepy to eat well.  Your baby is having trouble sleeping.   Your baby is wetting less than 3 diapers in a 24-hour period.  Your baby has less than 3 stools in a 24-hour period.  Your baby's skin or the white part of his or her eyes becomes yellow.   Your baby is not gaining weight by 57 days of age. SEEK IMMEDIATE MEDICAL CARE IF:  Your baby is overly tired (lethargic) and does not want to wake up and feed.  Your baby develops an unexplained fever.   This information is not intended to replace advice given to you by your health care provider. Make sure you discuss any questions you have with your health care provider.   Document Released: 11/17/2005 Document Revised: 08/08/2015 Document Reviewed: 05/11/2013 Elsevier Interactive Patient Education 2016 [employer]. Postpartum Depression and Baby Blues The postpartum period begins right after the birth of a baby. During this time, there is often a great amount of joy and excitement. It is also a time of many changes in the life of the parents. Regardless of how many times a mother gives birth, each child brings new challenges and dynamics to the family. It is not unusual to have feelings of excitement along with confusing shifts in moods, emotions, and thoughts. All mothers are at risk of developing postpartum depression or the "baby blues." These mood changes can occur right after giving birth, or they may occur many months after giving birth. The baby blues or postpartum depression can be mild or severe. Additionally, postpartum depression can go away rather quickly, or it can be a long-term condition.  CAUSES Raised hormone levels and the rapid drop in those levels are thought to be a main cause of postpartum depression and the baby blues. A number of hormones change during and after pregnancy. Estrogen and progesterone usually decrease right after the delivery of your baby. The levels of thyroid hormone and  various cortisol steroids also rapidly drop. Other factors that play a role in these mood changes include major life events and genetics.  RISK FACTORS If you have any of the following risks for the baby blues or postpartum depression, know what symptoms to watch out for during the postpartum period. Risk factors that may increase the likelihood of getting the baby blues or postpartum depression include:  Having a personal or family history of depression.   Having depression while being pregnant.   Having premenstrual mood issues or mood issues related to oral contraceptives.  Having a lot of life stress.   Having marital conflict.   Lacking a social support network.   Having a baby with special needs.   Having health problems, such as diabetes.  SIGNS  AND SYMPTOMS Symptoms of baby blues include:  Brief changes in mood, such as going from extreme happiness to sadness.  Decreased concentration.   Difficulty sleeping.   Crying spells, tearfulness.   Irritability.   Anxiety.  Symptoms of postpartum depression typically begin within the first month after giving birth. These symptoms include:  Difficulty sleeping or excessive sleepiness.   Marked weight loss.   Agitation.   Feelings of worthlessness.   Lack of interest in activity or food.  Postpartum psychosis is a very serious condition and can be dangerous. Fortunately, it is rare. Displaying any of the following symptoms is cause for immediate medical attention. Symptoms of postpartum psychosis include:   Hallucinations and delusions.   Bizarre or disorganized behavior.   Confusion or disorientation.  DIAGNOSIS  A diagnosis is made by an evaluation of your symptoms. There are no medical or lab tests that lead to a diagnosis, but there are various questionnaires that a health care provider may use to identify those with the baby blues, postpartum depression, or psychosis. Often, a screening tool  called the [place] Postnatal Depression Scale is used to diagnose depression in the postpartum period.  TREATMENT The baby blues usually goes away on its own in 1-2 weeks. Social support is often all that is needed. You will be encouraged to get adequate sleep and rest. Occasionally, you may be given medicines to help you sleep.  Postpartum depression requires treatment because it can last several months or longer if it is not treated. Treatment may include individual or group therapy, medicine, or both to address any social, physiological, and psychological factors that may play a role in the depression. Regular exercise, a healthy diet, rest, and social support may also be strongly recommended.  Postpartum psychosis is more serious and needs treatment right away. Hospitalization is often needed. HOME CARE INSTRUCTIONS  Get as much rest as you can. Nap when the baby sleeps.   Exercise regularly. Some women find yoga and walking to be beneficial.   Eat a balanced and nourishing diet.   Do little things that you enjoy. Have a cup of tea, take a bubble bath, read your favorite magazine, or listen to your favorite music.  Avoid alcohol.   Ask for help with household chores, cooking, grocery shopping, or running errands as needed. Do not try to do everything.   Talk to people close to you about how you are feeling. Get support from your partner, family members, friends, or other new moms.  Try to stay positive in how you think. Think about the things you are grateful for.   Do not spend a lot of time alone.   Only take over-the-counter or prescription medicine as directed by your health care provider.  Keep all your postpartum appointments.   Let your health care provider know if you have any concerns.  SEEK MEDICAL CARE IF: You are having a reaction to or problems with your medicine. SEEK IMMEDIATE MEDICAL CARE IF:  You have suicidal feelings.   You think you may harm the  baby or someone else. MAKE SURE YOU:  Understand these instructions.  Will watch your condition.  Will get help right away if you are not doing well or get worse.   This information is not intended to replace advice given to you by your health care provider. Make sure you discuss any questions you have with your health care provider.   Document Released: 08/21/2004 Document Revised: 11/22/2013 Document Reviewed: 08/29/2013 Elsevier Interactive  Patient Education 2016 [employer]. Postpartum Care After Cesarean Delivery After you deliver your newborn (postpartum period), the usual stay in the hospital is 24-72 hours. If there were problems with your labor or delivery, or if you have other medical problems, you might be in the hospital longer.  While you are in the hospital, you will receive help and instructions on how to care for yourself and your newborn during the postpartum period.  While you are in the hospital:  It is normal for you to have pain or discomfort from the incision in your abdomen. Be sure to tell your nurses when you are having pain, where the pain is located, and what makes the pain worse.  If you are breastfeeding, you may feel uncomfortable contractions of your uterus for a couple of weeks. This is normal. The contractions help your uterus get back to normal size.  It is normal to have some bleeding after delivery.  For the first 1-3 days after delivery, the flow is red and the amount may be similar to a period.  It is common for the flow to start and stop.  In the first few days, you may pass some small clots. Let your nurses know if you begin to pass large clots or your flow increases.  Do not  flush blood clots down the toilet before having the nurse look at them.  During the next 3-10 days after delivery, your flow should become more watery and pink or brown-tinged in color.  Ten to fourteen days after delivery, your flow should be a small amount of  yellowish-white discharge.  The amount of your flow will decrease over the first few weeks after delivery. Your flow may stop in 6-8 weeks. Most women have had their flow stop by 12 weeks after delivery.  You should change your sanitary pads frequently.  Wash your hands thoroughly with soap and water for at least 20 seconds after changing pads, using the toilet, or before holding or feeding your newborn.  Your intravenous (IV) tubing will be removed when you are drinking enough fluids.  The urine drainage tube (urinary catheter) that was inserted before delivery may be removed within 6-8 hours after delivery or when feeling returns to your legs. You should feel like you need to empty your bladder within the first 6-8 hours after the catheter has been removed.  In case you become weak, lightheaded, or faint, call your nurse before you get out of bed for the first time and before you take a shower for the first time.  Within the first few days after delivery, your breasts may begin to feel tender and full. This is called engorgement. Breast tenderness usually goes away within 48-72 hours after engorgement occurs. You may also notice milk leaking from your breasts. If you are not breastfeeding, do not stimulate your breasts. Breast stimulation can make your breasts produce more milk.  Spending as much time as possible with your newborn is very important. During this time, you and your newborn can feel close and get to know each other. Having your newborn stay in your room (rooming in) will help to strengthen the bond with your newborn. It will give you time to get to know your newborn and become comfortable caring for your newborn.  Your hormones change after delivery. Sometimes the hormone changes can temporarily cause you to feel sad or tearful. These feelings should not last more than a few days. If these feelings last longer than that,  you should talk to your caregiver.  If desired, talk to your  caregiver about methods of family planning or contraception.  Talk to your caregiver about immunizations. Your caregiver may want you to have the following immunizations before leaving the hospital:  Tetanus, diphtheria, and pertussis (Tdap) or tetanus and diphtheria (Td) immunization. It is very important that you and your family (including grandparents) or others caring for your newborn are up-to-date with the Tdap or Td immunizations. The Tdap or Td immunization can help protect your newborn from getting ill.  Rubella immunization.  Varicella (chickenpox) immunization.  Influenza immunization. You should receive this annual immunization if you did not receive the immunization during your pregnancy.   This information is not intended to replace advice given to you by your health care provider. Make sure you discuss any questions you have with your health care provider.   Document Released: 08/11/2012 Document Reviewed: 08/11/2012 Elsevier Interactive Patient Education [employer].

## 2016-05-24 NOTE — Lactation Note (Signed)
This note was copied from a baby's chart. Lactation Consultation Note  Patient Name: Caitlin Simmons NWGNF'AToday's Date: 05/24/2016 Reason for consult: Follow-up assessment;Other (Comment) (6% weight loss )  Baby is 10146 hours old and has been to the breast consistently , LC reviewed doc flow sheets/ WNL  for D/C.  Per mom breast feeding is going well. No complaints of soreness.  Sore nipple and engorgement prevention and tx reviewed.  Per mom with her 1st baby had 7 months due to slow weight gain had to supplement.  LC discussed nutritive feeding patterns vs non - nutritive feeding patterns . And baby's body signals when hungry and  When satisfied.  Mother informed of post-discharge support and given phone number to the lactation department, including services for phone call assistance; out-patient appointments; and breastfeeding support group. List of other breastfeeding resources in the community given in the handout. Encouraged mother to call for problems or concerns related to breastfeeding.   Maternal Data    Feeding Feeding Type:  (per mom baby last 2 hours ago, )  LATCH Score/Interventions                Intervention(s): Breastfeeding basics reviewed     Lactation Tools Discussed/Used     Consult Status Consult Status: Complete Date: 05/24/16    Kathrin Greathouseorio, Cotton Beckley Ann 05/24/2016, 11:48 AM

## 2017-07-02 ENCOUNTER — Ambulatory Visit (HOSPITAL_COMMUNITY)
Admission: EM | Admit: 2017-07-02 | Discharge: 2017-07-02 | Disposition: A | Discharge status: Home / Self Care | Payer: No Typology Code available for payment source | Attending: Family Medicine | Admitting: Family Medicine

## 2017-07-02 ENCOUNTER — Encounter (HOSPITAL_COMMUNITY): Payer: Self-pay | Admitting: Family Medicine

## 2017-07-02 DIAGNOSIS — J029 Acute pharyngitis, unspecified: Secondary | ICD-10-CM | POA: Diagnosis present

## 2017-07-02 LAB — POCT RAPID STREP A: STREPTOCOCCUS, GROUP A SCREEN (DIRECT): NEGATIVE

## 2017-07-02 MED ORDER — MAGIC MOUTHWASH W/LIDOCAINE: 5.0000 mL | Freq: Three times a day (TID) | ORAL | 0 refills | Status: AC | PRN

## 2017-07-02 MED ORDER — CLINDAMYCIN HCL 300 MG PO CAPS: 300.0000 mg | ORAL_CAPSULE | Freq: Three times a day (TID) | ORAL | 0 refills | Status: AC

## 2017-07-02 NOTE — ED Provider Notes (Signed)
  [facility]   [id number] 07/02/17 Arrival Time: 1828  ASSESSMENT & PLAN:  1. Pharyngitis, unspecified etiology     Meds ordered this encounter  Medications  . clindamycin (CLEOCIN) 300 MG capsule    Sig: Take 1 capsule (300 mg total) by mouth 3 (three) times daily.    Dispense:  21 capsule    Refill:  0    Order Specific Question:   Supervising Provider    Answer:   Mardella LaymanHAGLER, BRIAN [id number]]  . magic mouthwash w/lidocaine SOLN    Sig: Take 5 mLs by mouth 3 (three) times daily as needed for mouth pain.    Dispense:  100 mL    Refill:  0    1 Part Lidocaine, 1 Part Nystatin, 1 Part Maalox, 1 Part Benadryl    Order Specific Question:   Supervising Provider    Answer:   Mardella LaymanHAGLER, BRIAN [[id number]]    Reviewed expectations re: course of current medical issues. Questions answered. Outlined signs and symptoms indicating need for more acute intervention. Patient verbalized understanding. After Visit Summary given.   SUBJECTIVE:  Caitlin Simmons is a 36 y.o. female who presents with complaint of Sore throat, swelling in her throat, pain with swallowing, and a fever as high as 102 at home, reports more swelling on her right side. Denies any nausea, vomiting, congestion, cough, or other symptoms. She has had over-the-counter antipruritics with minimal relief. Denies any other complaints  ROS: As per HPI.   OBJECTIVE:  Vitals:   07/02/17 1843  BP: 122/78  Pulse: 65  Resp: 18  Temp: 98.7 F (37.1 C)  SpO2: 98%     General appearance: alert; no distress HEENT: normocephalic; atraumatic; conjunctivae normal; TMs normal; nasal mucosa normal; oral mucosa normal, Right tonsil +3, left tonsil +2, both are erythemic, without exudate, uvula midline, no evidence of tonsillar abscess Neck: supple, there is tender cervical lymphadenopathy Lungs: clear to auscultation bilaterally Heart: regular rate and rhythm Abdomen: soft, non-tender; bowel sounds normal; no masses or  organomegaly; no guarding or rebound tenderness Back: no CVA tenderness Extremities: no cyanosis or edema; symmetrical with no gross deformities Skin: warm and dry Neurologic: normal symmetric reflexes; normal gait Psychological:  alert and cooperative; normal mood and affect  Results for orders placed or performed during the hospital encounter of 07/02/17  POCT rapid strep A [facility]  Result Value Ref Range   Streptococcus, Group A Screen (Direct) NEGATIVE NEGATIVE    Labs Reviewed  POCT RAPID STREP A    No results found.  No Known Allergies  PMHx, SurgHx, SocialHx, Medications, and Allergies were reviewed in the Visit Navigator and updated as appropriate.      Dorena BodoKennard, Fronia Depass, NP 07/02/17 [phone]

## 2017-07-02 NOTE — Discharge Instructions (Signed)
For your infection, prescribed Magic mouthwash, swish and swallow up to 3 times a day for pain, but he times a day for one week. If you have continued swelling in your mouth, or worsening of the symptoms, go to the emergency room

## 2017-07-02 NOTE — ED Triage Notes (Signed)
Pt here for sore throat and fever. sts more pain in the right tonsillar area.

## 2017-07-04 ENCOUNTER — Encounter (HOSPITAL_COMMUNITY): Payer: Self-pay | Admitting: *Deleted

## 2017-07-04 ENCOUNTER — Emergency Department (HOSPITAL_COMMUNITY)
Admission: EM | Admit: 2017-07-04 | Discharge: 2017-07-04 | Disposition: A | Discharge status: Home / Self Care | Payer: No Typology Code available for payment source | Attending: Emergency Medicine | Admitting: Emergency Medicine

## 2017-07-04 DIAGNOSIS — F17210 Nicotine dependence, cigarettes, uncomplicated: Secondary | ICD-10-CM | POA: Insufficient documentation

## 2017-07-04 DIAGNOSIS — J029 Acute pharyngitis, unspecified: Secondary | ICD-10-CM | POA: Diagnosis present

## 2017-07-04 DIAGNOSIS — J36 Peritonsillar abscess: Secondary | ICD-10-CM | POA: Diagnosis not present

## 2017-07-04 MED ORDER — PREDNISONE 20 MG PO TABS: 40.0000 mg | ORAL_TABLET | Freq: Every day | ORAL | 0 refills | Status: AC

## 2017-07-04 MED ORDER — DEXAMETHASONE 10 MG/ML FOR PEDIATRIC ORAL USE
10.0000 mg | Freq: Once | INTRAMUSCULAR | Status: AC
  Administered 2017-07-04: 10 mg via ORAL
  Filled 2017-07-04: qty 1

## 2017-07-04 MED ORDER — HYDROCODONE-ACETAMINOPHEN 7.5-325 MG/15ML PO SOLN: 10.0000 mL | Freq: Four times a day (QID) | ORAL | 0 refills | Status: AC | PRN

## 2017-07-04 MED ORDER — HYDROCODONE-ACETAMINOPHEN 7.5-325 MG/15ML PO SOLN
10.0000 mL | Freq: Once | ORAL | Status: AC
  Administered 2017-07-04: 10 mL via ORAL
  Filled 2017-07-04: qty 15

## 2017-07-04 NOTE — ED Provider Notes (Signed)
[facility]-EMERGENCY DEPT Provider Note   CSN: [id number] Arrival date & time: 07/04/17  0019     History   Chief Complaint Chief Complaint  Patient presents with  . Sore Throat    HPI Caitlin Simmons is a 36 y.o. female who presents with a worsening sore throat. She states that it started hurting 5 days ago. she also developed a fever. [facility] and was prescribed Clindamycin and magic mouthwash. Rapid strep was negative at that time. She states the pain and swelling has worsened and now she is having difficulty swallowing and has to spit out her secretions. Fevers have gotten better.   HPI  Past Medical History:  Diagnosis Date  . Abnormal Pap smear   . Headache   . Hx of varicella   . Scoliosis   . Vaginal Pap smear, abnormal     Patient Active Problem List   Diagnosis Date Noted  . Cesarean delivery delivered 05/22/2016  . History of cesarean section, low transverse 05/20/2016  . 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 Light cigarette smoker 05/20/2016  . Obesity (BMI 30.0-34.9) 05/20/2016    Past Surgical History:  Procedure Laterality Date  . CESAREAN SECTION N/A 01/10/2013   Procedure: CESAREAN SECTION of baby boy at 0125  APGAR 9/9;  Surgeon: Delbert Harnessaniel H Moore, MD;  Location: [facility];  Service: Obstetrics;  Laterality: N/A;  . CESAREAN SECTION N/A 05/22/2016   Procedure: REPEAT CESAREAN SECTION;  Surgeon: Silverio LaySandra Rivard, MD;  [facility];  Service: Obstetrics;  Laterality: N/A;   need RNFA - [name]  . COLPOSCOPY      OB History    Gravida Para Term Preterm AB Living   2 2 2     2    SAB TAB Ectopic Multiple Live Births         0 2       Home Medications    Prior to Admission medications   Medication Sig Start Date End Date Taking? Authorizing Provider  clindamycin (CLEOCIN) 300 MG capsule Take 1 capsule (300 mg total) by mouth 3 (three) times daily. 07/02/17   Dorena BodoKennard, Lawrence, NP  ibuprofen (ADVIL,MOTRIN) 600 MG tablet Take 1 tablet (600 mg  total) by mouth every 6 (six) hours as needed for fever, headache, mild pain, moderate pain or cramping. 05/24/16   Sherre ScarletWilliams, Kimberly, CNM  magic mouthwash w/lidocaine SOLN Take 5 mLs by mouth 3 (three) times daily as needed for mouth pain. 07/02/17   Dorena BodoKennard, Lawrence, NP  oxyCODONE-acetaminophen (PERCOCET/ROXICET) 5-325 MG tablet Take 1 tablet by mouth every 4 (four) hours as needed (pain scale 4-7). 05/24/16   Sherre ScarletWilliams, Kimberly, CNM  Prenatal Vit-Fe Fumarate-FA (PRENATAL MULTIVITAMIN) TABS Take 1 tablet by mouth daily.    [provider]    Family History Family History  Problem Relation Age of Onset  . Hypertension Mother   . Heart attack Mother   . Other Neg Hx     Social History Social History  Substance Use Topics  . Smoking status: Light Tobacco Smoker    Packs/day: 0.10    Types: Cigarettes  . Smokeless tobacco: Never Used  . Alcohol use No     Allergies   Patient has no known allergies.   Review of Systems Review of Systems  Constitutional: Positive for fever.  HENT: Positive for sore throat, trouble swallowing and voice change. Negative for drooling and rhinorrhea.      Physical Exam Updated Vital Signs BP 120/82 (BP Location:  Left Arm)   Pulse 78   Temp 98.9 F (37.2 C) (Oral)   Resp 16   Ht 5\' 3"  (1.6 m)   Wt 78.9 kg (174 lb)   LMP 06/20/2017   SpO2 99%   BMI 30.82 kg/m   Physical Exam  Constitutional: She is oriented to person, place, and time. She appears well-developed and well-nourished. No distress.  Resting in NAD. Appears uncomfortable. Hot potato voice. Handling secretions  HENT:  Head: Normocephalic and atraumatic.  Mouth/Throat: Mucous membranes are normal. Posterior oropharyngeal edema, posterior oropharyngeal erythema and tonsillar abscesses (Right peritonsillar abscess) present. No oropharyngeal exudate.  Eyes: Pupils are equal, round, and reactive to light. Conjunctivae are normal. Right eye exhibits no discharge. Left eye  exhibits no discharge. No scleral icterus.  Neck: Normal range of motion.  Cardiovascular: Normal rate and regular rhythm.  Exam reveals no gallop and no friction rub.   No murmur heard. Pulmonary/Chest: Effort normal and breath sounds normal. No respiratory distress. She has no wheezes. She has no rales. She exhibits no tenderness.  Abdominal: She exhibits no distension.  Neurological: She is alert and oriented to person, place, and time.  Skin: Skin is warm and dry.  Psychiatric: She has a normal mood and affect. Her behavior is normal.  Nursing note and vitals reviewed.    ED Treatments / Results  Labs (all labs ordered are listed, but only abnormal results are displayed) Labs Reviewed - No data to display  EKG  EKG Interpretation None       Radiology No results found.  Procedures Procedures (including critical care time)  Medications Ordered in ED Medications  HYDROcodone-acetaminophen (HYCET) 7.5-325 mg/15 ml solution 10 mL (10 mLs Oral Given 07/04/17 0312)  dexamethasone (DECADRON) 10 MG/ML injection for Pediatric ORAL use 10 mg (10 mg Oral Given 07/04/17 [id number])     Initial Impression / Assessment and Plan / ED Course  I have reviewed the triage vital signs and the nursing notes.  Pertinent labs & imaging results that were available during my care of the patient were reviewed by me and considered in my medical decision making (see chart for details).  36 year old female with R PTA. She is handling secretions but is uncomfortable. Vitals are normal. Pain medicine and steroids given. Will attempt aspiration of PTA.   Attempted aspiration - was unsuccessful. Will rx pain medicine and steroids and have her continue Clindamycin. She tolerated PO in the ED. Advised follow up with ENT if symptoms are not improving and return for worsening symptoms  Final Clinical Impressions(s) / ED Diagnoses   Final diagnoses:  Peritonsillar abscess    New Prescriptions New  Prescriptions   No medications on file     Beryle QuantGekas, Livana Yerian Marie, PA-C 07/04/17 0612    Dione BoozeGlick, David, MD 07/04/17 [phone]

## 2017-07-04 NOTE — ED Triage Notes (Signed)
The pt is c/o a sore throat since yesterday  [facility] and had a neg strep screen.  She has more paion and swelling in her throat today  Low grade temp all day  Lm,p 2 weeks ago

## 2017-07-04 NOTE — ED Triage Notes (Signed)
She is taking an antibiotic and magic mouith wash

## 2017-07-04 NOTE — ED Notes (Signed)
PT states understanding of care given, follow up care, and medication prescribed. PT ambulated from ED to car with a steady gait. 

## 2017-07-04 NOTE — ED Notes (Signed)
Patient is A&Ox4.  No signs of distress noted.  Please see providers complete history and physical exam.  

## 2017-07-04 NOTE — Discharge Instructions (Signed)
Continue Clindamycin Start Prednisone once daily Take pain medicine as needed Eat a soft diet/liquids Follow up with ENT

## 2017-07-05 LAB — CULTURE, GROUP A STREP (THRC)

## 2017-07-06 NOTE — ED Notes (Signed)
Wasted medication into the pt sink. Charge notified about waste in the morning.

## 2022-12-22 ENCOUNTER — Other Ambulatory Visit (HOSPITAL_BASED_OUTPATIENT_CLINIC_OR_DEPARTMENT_OTHER): Payer: Self-pay

## 2022-12-22 DIAGNOSIS — R0683 Snoring: Secondary | ICD-10-CM

## 2023-03-07 ENCOUNTER — Encounter (HOSPITAL_BASED_OUTPATIENT_CLINIC_OR_DEPARTMENT_OTHER): Payer: Self-pay

## 2023-03-07 DIAGNOSIS — R0683 Snoring: Secondary | ICD-10-CM
